# Patient Record
Sex: Male | Born: 1960 | Race: White | Hispanic: No | Marital: Married | Smoking: Former smoker
Health system: Southern US, Academic
[De-identification: ages and names within clinical notes are randomized; demographics above are authoritative.]

## PROBLEM LIST (undated history)

## (undated) DIAGNOSIS — E7800 Pure hypercholesterolemia, unspecified: Secondary | ICD-10-CM

## (undated) HISTORY — PX: HX GALL BLADDER SURGERY/CHOLE: SHX55

## (undated) HISTORY — PX: SHOULDER SURGERY: SHX246

## (undated) HISTORY — PX: HX KNEE ARTHROSCOPY: SHX127

## (undated) HISTORY — DX: Pure hypercholesterolemia, unspecified: E78.00

---

## 1992-02-24 ENCOUNTER — Other Ambulatory Visit (HOSPITAL_COMMUNITY): Payer: Self-pay

## 2021-02-13 IMAGING — MR MRI HIP RT W/O CONTRAST
4 of 5 series · 21 of 40 positions shown · non-contrast
Comparison: None previous.

﻿EXAM:  [id number]   MRI HIP RT W/O CONTRAST
INDICATION: 60-year-old with persistent low back pain and right hip pain.
TECHNIQUE: Axial and coronal images of pelvis and right hip were obtained including T1, fat suppressed T2 and inversion recovery sequences.

[Series 7: T2 fat-sat · axial · right · 4.5mm · 0.96mm/px · z∈[-82,+63]mm · 8 of 30 slices shown (1 of 2)]
[im 1/30]
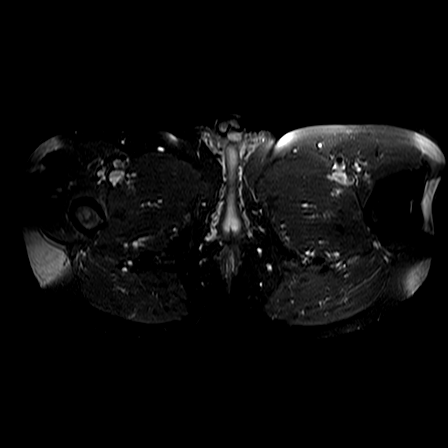
[im 4/30]
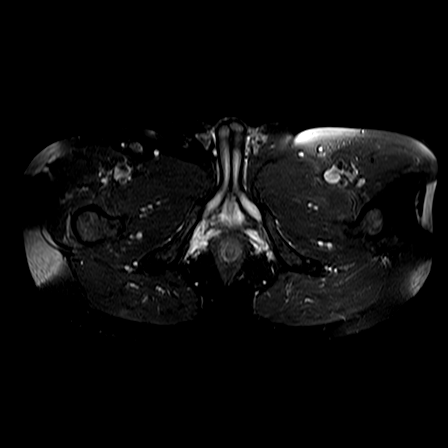
[im 10/30]
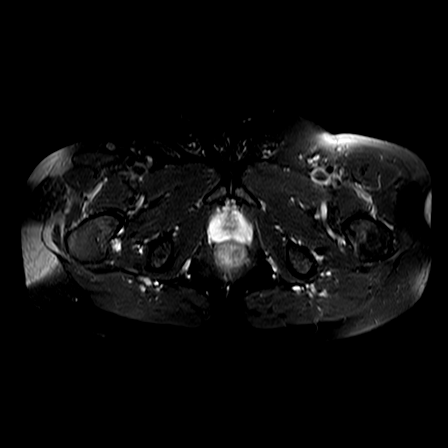
[im 13/30]
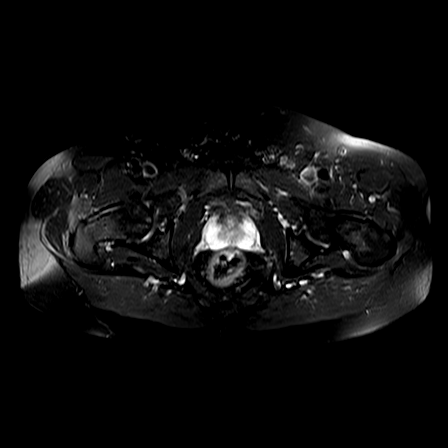
[im 17/30]
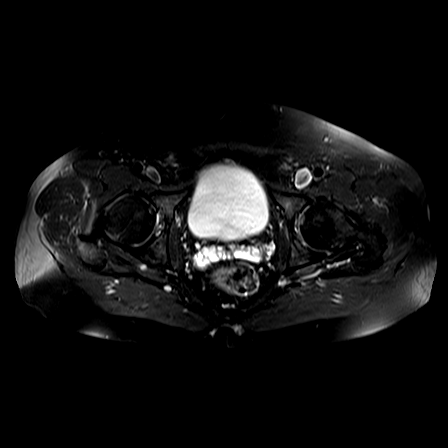
[im 20/30]
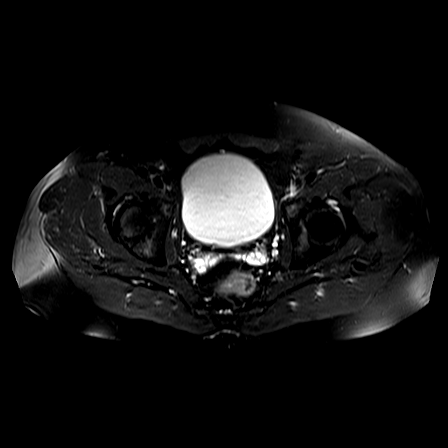
[im 26/30]
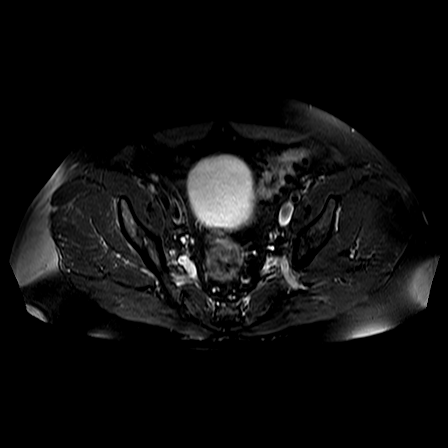
[im 30/30]
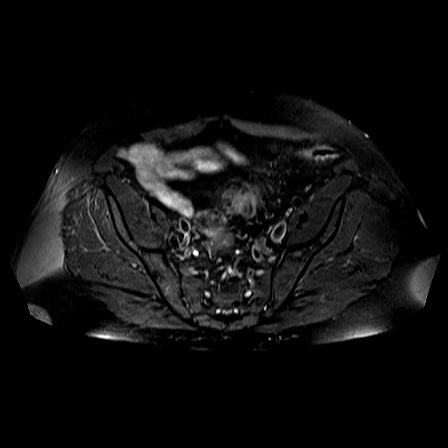

[Series 8: T1 · axial · right · 4.5mm · 0.84mm/px · z∈[-82,+43]mm · 4 of 30 slices shown (1 of 2)]
[im 1/30]
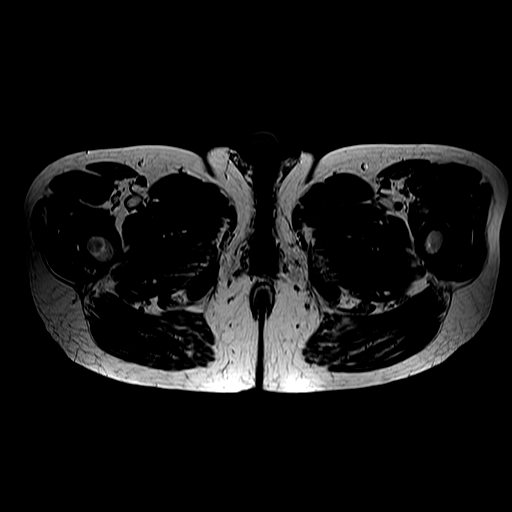
[im 4/30]
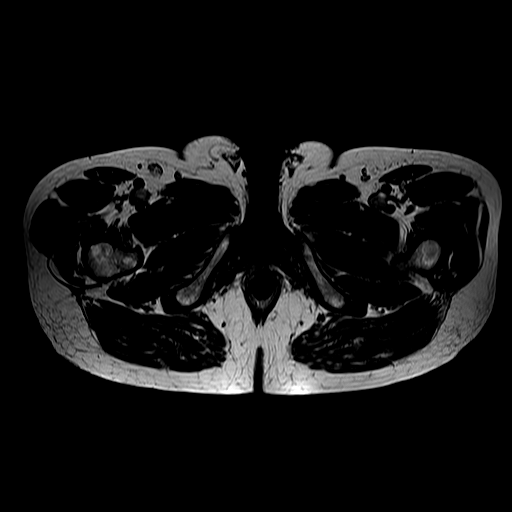
[im 15/30]
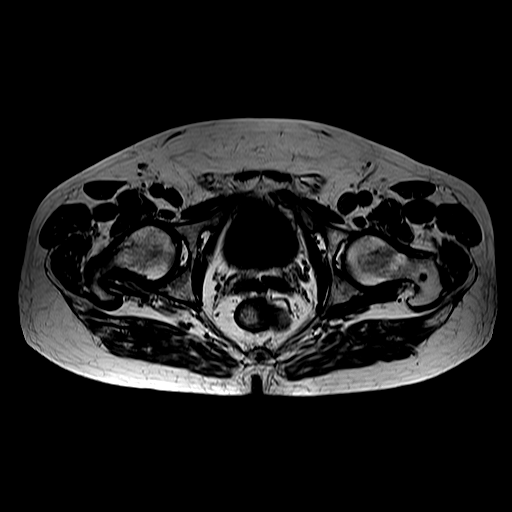
[im 26/30]
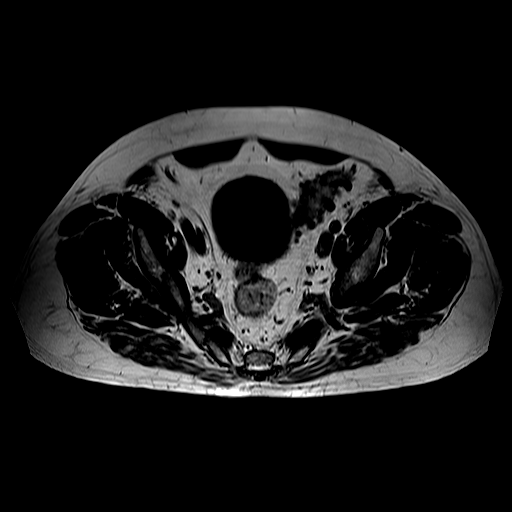

[Series 10: T1 · coronal · right · 4.5mm · 0.82mm/px · 3 of 20 slices shown (2 of 2)]
[im 4/20]
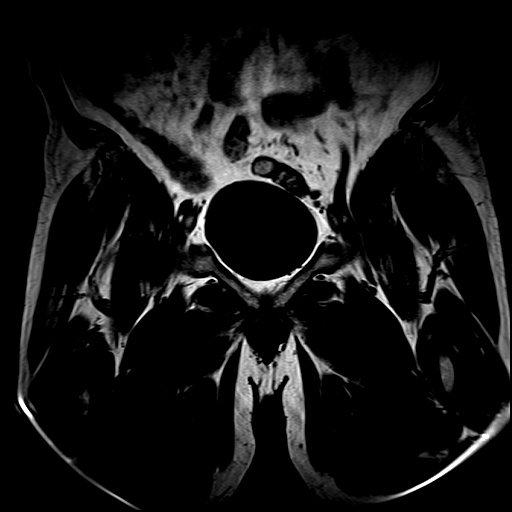
[im 12/20]
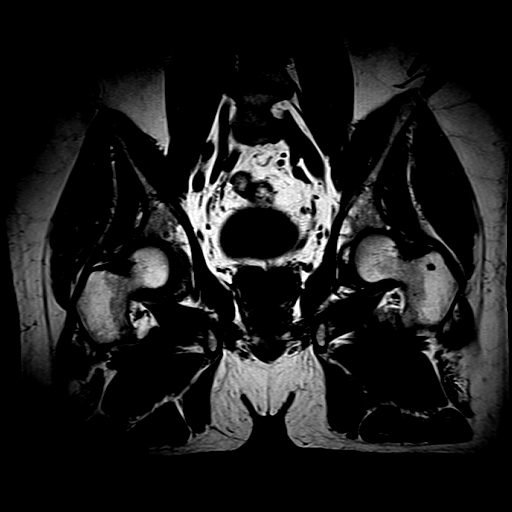
[im 20/20]
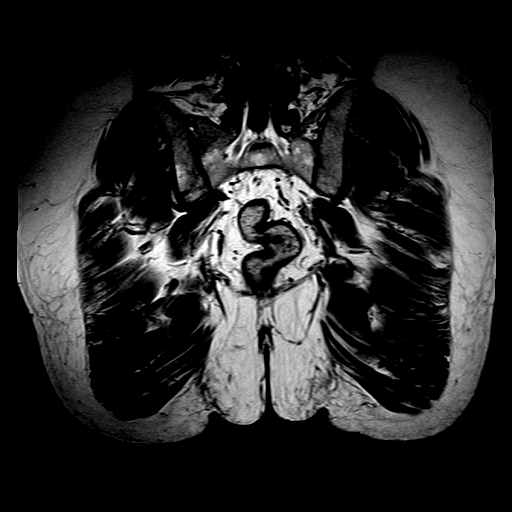

[Series 11: T2 fat-sat · coronal · right · 4.5mm · 0.82mm/px · 6 of 20 slices shown (2 of 2)]
[im 1/20]
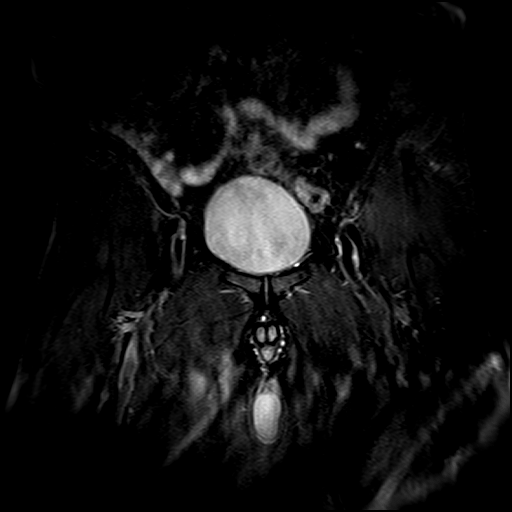
[im 4/20]
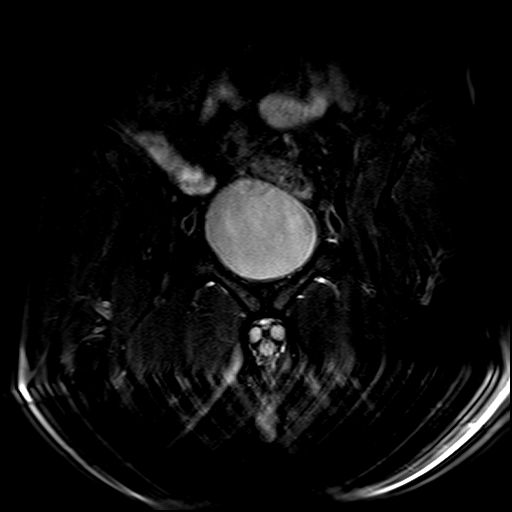
[im 8/20]
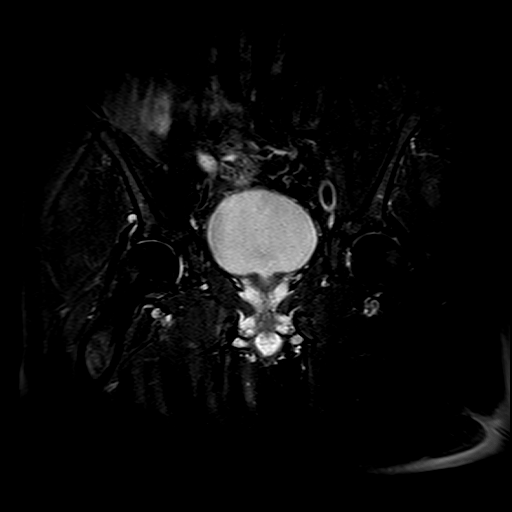
[im 12/20]
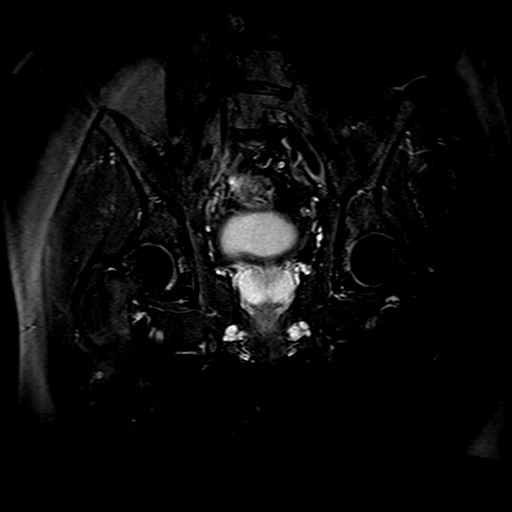
[im 16/20]
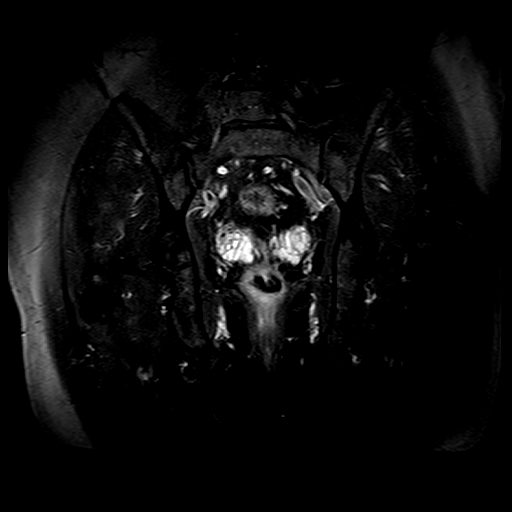
[im 20/20]
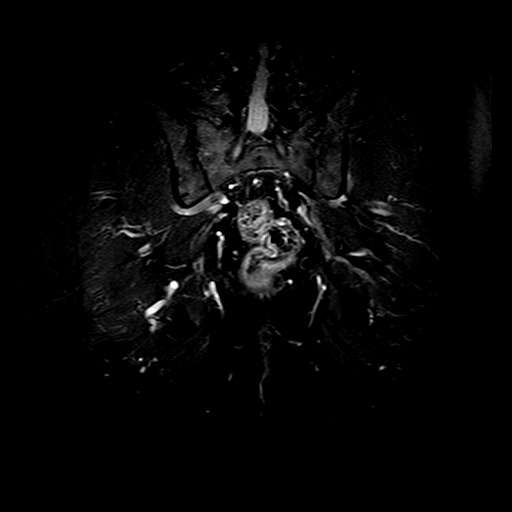

[21 of 40 positions shown; findings below may reference images not displayed]

FINDINGS: No acute bony lesions of the pelvis are seen.  Moderate degenerative arthritic changes of both hip joints, right worse than the left.  Small 6-7 mm cyst in the superior labrum lip is noted at the right hip without a displaced tear.  There is extension of the cyst to the paralabral location above the acetabulum.  Findings are suggestive of chronic tear and degenerative changes of the superior labral lip of the acetabulum.  Soft tissues at the right hip are normal.
IMPRESSION: 1. No MRI evidence of avascular necrosis. 

2. Moderate degenerative arthritic changes of both hips, right worse than the left.  

3. Degenerative changes of the superior lip of the acetabular labrum with cyst and paralabral cyst as described above due to chronic changes of the acetabular labrum.  No displaced fragments are seen.

## 2021-02-13 IMAGING — MR MRI LUMBAR SPINE WITHOUT CONTRAST
5 of 6 series · 31 of 48 positions shown · non-contrast
Comparison: None previous.

﻿EXAM:  [id number]   MRI LUMBAR SPINE WITHOUT CONTRAST
INDICATION: 60-year-old with persistent low back pain and radicular symptoms to right hip.  No history of malignancy or back surgery.
TECHNIQUE: Axial, coronal and sagittal images were obtained including T1, T2 and STIR sequences.

[Series 6: T2 · sagittal · 4.0mm · 0.94mm/px · 6 of 15 slices shown (1 of 3)]
[im 1/15]
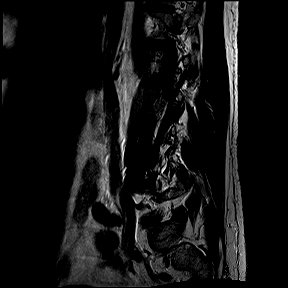
[im 3/15]
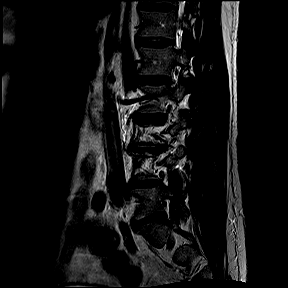
[im 6/15]
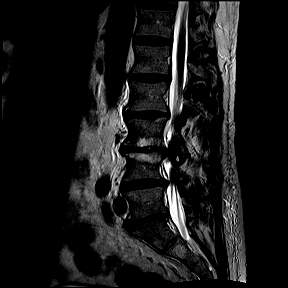
[im 9/15]
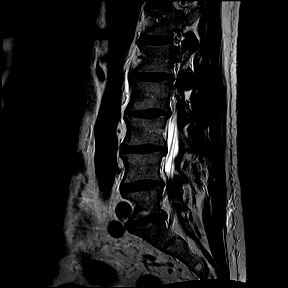
[im 12/15]
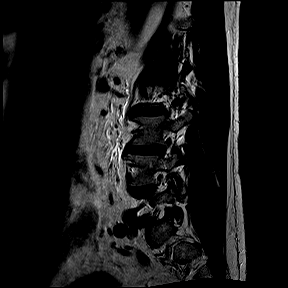
[im 15/15]
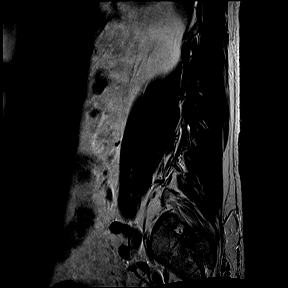

[Series 7: T1 · sagittal · 4.0mm · 0.94mm/px · 7 of 15 slices shown (1 of 2)]
[im 1/15]
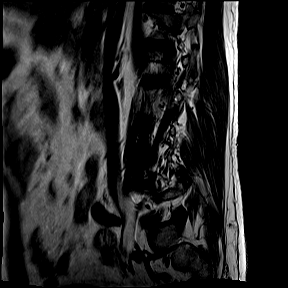
[im 3/15]
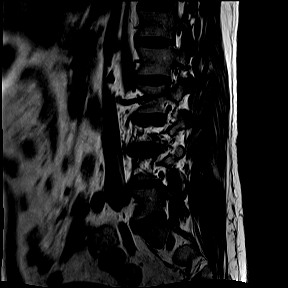
[im 5/15]
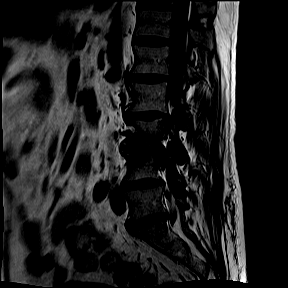
[im 8/15]
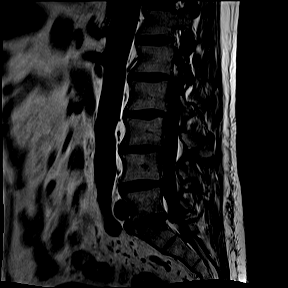
[im 10/15]
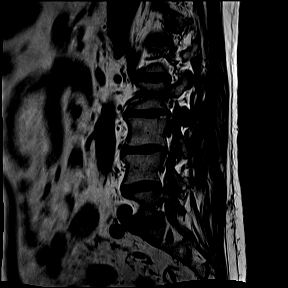
[im 12/15]
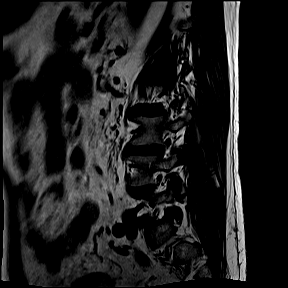
[im 15/15]
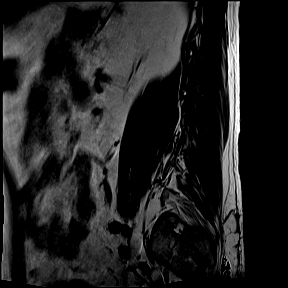

[Series 9: T2 · axial · 4.0mm · 0.52mm/px · z∈[-146,+34]mm · 8 of 23 slices shown (2 of 3)]
[im 1/23]
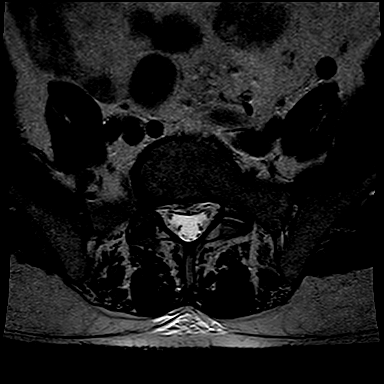
[im 3/23]
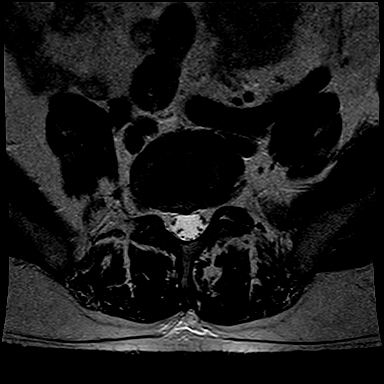
[im 8/23]
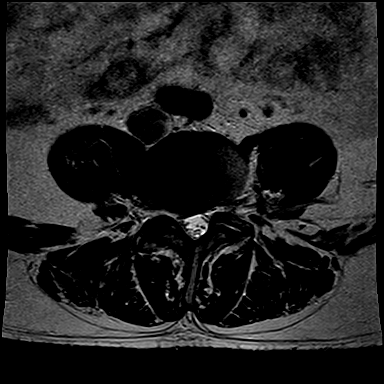
[im 10/23]
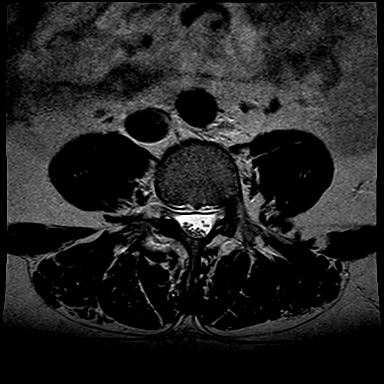
[im 13/23]
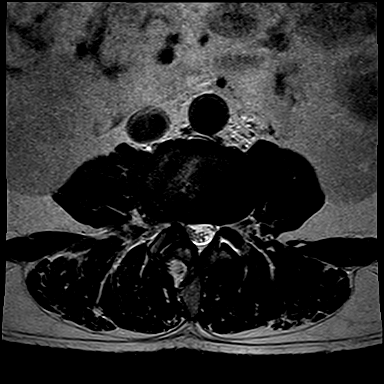
[im 15/23]
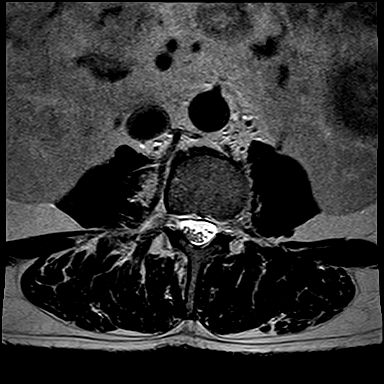
[im 20/23]
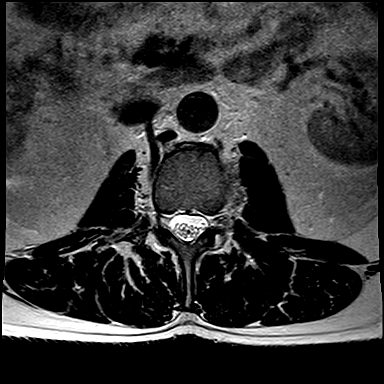
[im 23/23]
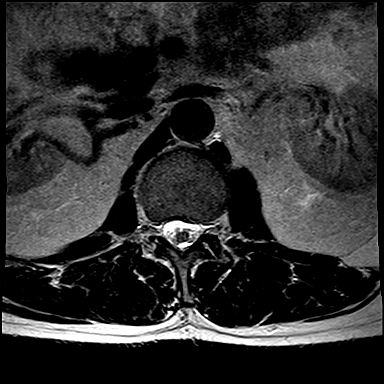

[Series 10: T1 · axial · 4.0mm · 0.52mm/px · z∈[-146,-136]mm · 2 of 23 slices shown (2 of 2)]
[im 1/23]
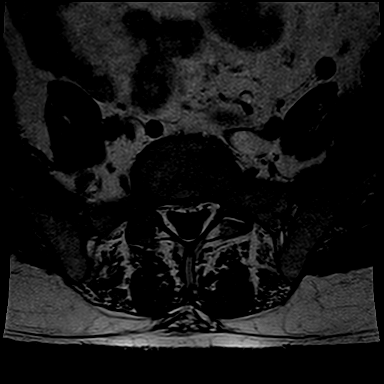
[im 3/23]
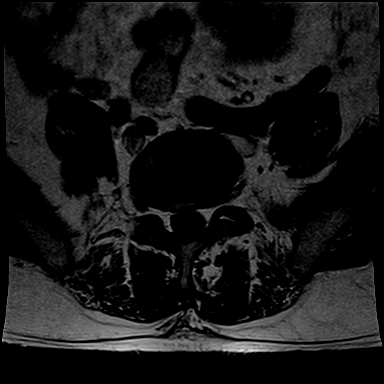

[Series 11: T2 · coronal · 5.0mm · 0.82mm/px · 8 of 18 slices shown (3 of 3)]
[im 1/18]
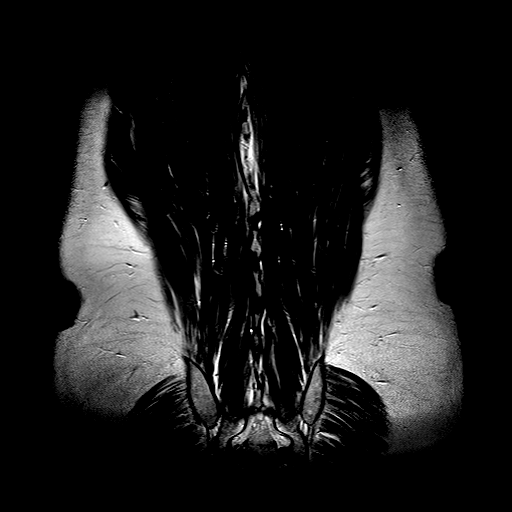
[im 3/18]
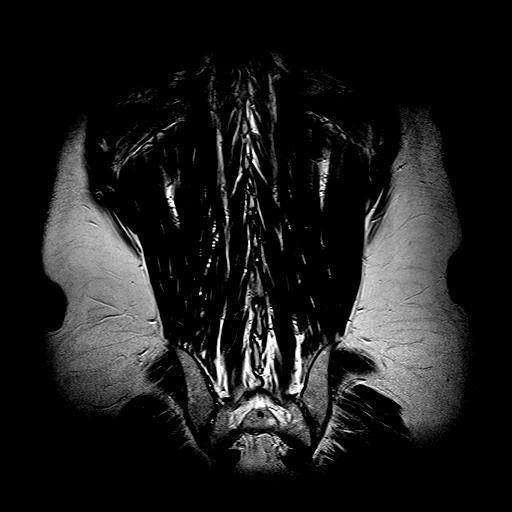
[im 5/18]
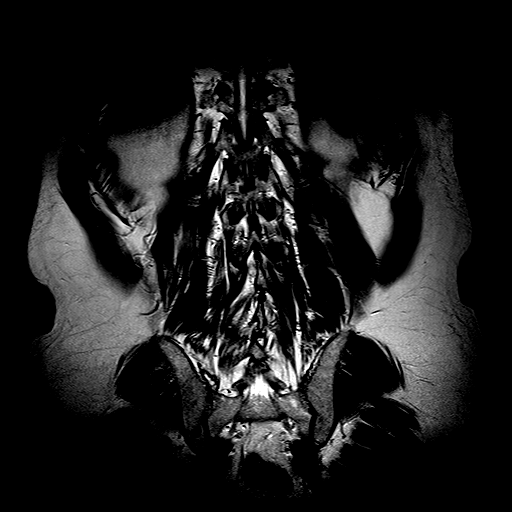
[im 8/18]
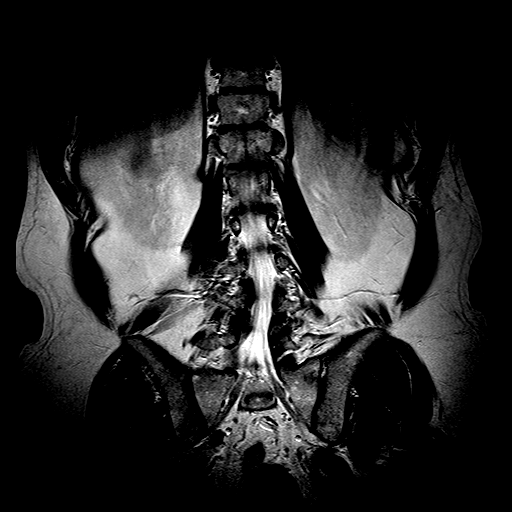
[im 10/18]
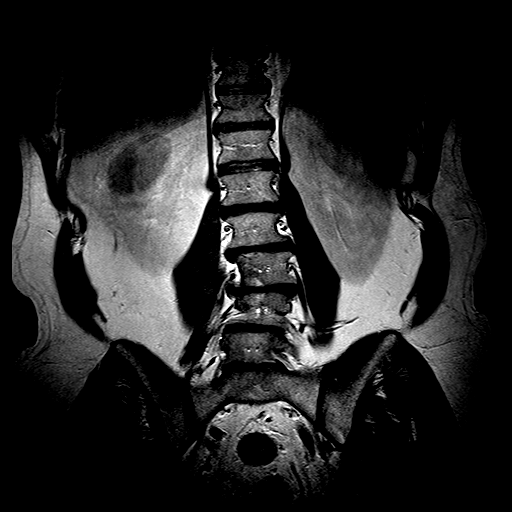
[im 13/18]
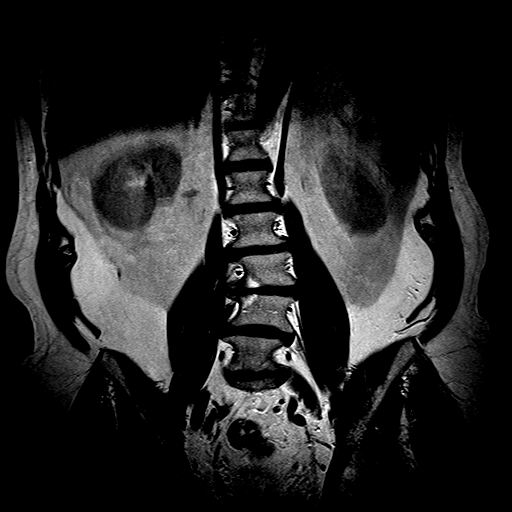
[im 15/18]
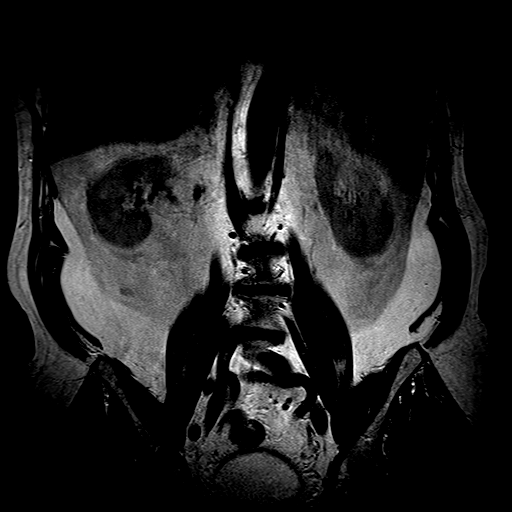
[im 18/18]
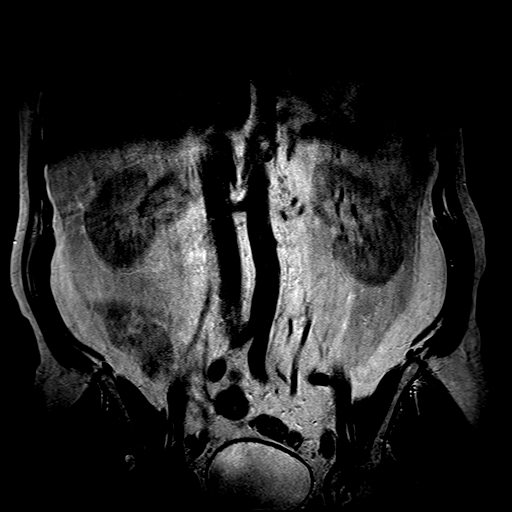

[31 of 48 positions shown; findings below may reference images not displayed]

FINDINGS: No acute bony lesions of lumbar vertebrae are seen.  Lower spinal cord and cauda equina are normal.  

At L1-L2 level, no focal disc lesions are seen.  

At L2-L3 level, degenerative disc disease with bulging annulus and facet arthropathy are causing mild compromise of thecal sac and moderate compromise of both lateral recesses.  AP diameter of thecal sac in the midline measures 8.7 mm.  

At L3-L4 level, severe degenerative disc disease with mild Modic inflammatory changes on both sides of the disc.  Severe bilateral facet arthropathy is noted.  Significant compromise of right lateral recess and right neural foramen due to degenerative changes and facet changes with moderate degree of left foraminal stenosis at this level.  AP diameter of thecal sac in the midline at L3-L4 level measures 9.9 mm.  

At L4-L5 level, asymmetric bulging annulus to the right with small broad-based disc protrusion at the right lateral recess causing moderate to significant compromise of right lateral recess and right neural foramen.  The disc protrusion is impinging on the right L5 and L4 nerve roots at this level.  

At L5-S1 level, severe degenerative disc disease is noted with bulging annulus causing mild to moderate bilateral foraminal narrowing, left more than the right.  

Paravertebral soft tissues are unremarkable.
IMPRESSION: 1. No acute bone changes of lumbar vertebrae. 

2. At L3-L4 level, severe degenerative disc disease with mild Modic inflammatory changes on both sides of the disc.  Severe bilateral facet arthropathy is noted.  Significant compromise of right lateral recess and right neural foramen due to degenerative changes and facet changes with moderate degree of left foraminal stenosis at this level.  AP diameter of thecal sac in the midline at L3-L4 level measures 9.9 mm.  

3. At L4-L5 level, asymmetric bulging annulus to the right with small broad-based disc protrusion at the right lateral recess causing moderate to significant compromise of right lateral recess and right neural foramen.  The disc protrusion is impinging on the right L5 and L4 nerve roots at this level.  

4. At L5-S1 level, severe degenerative disc disease is noted with bulging annulus causing mild to moderate bilateral foraminal narrowing, left more than the right.

## 2021-08-14 ENCOUNTER — Other Ambulatory Visit: Payer: 59 | Attending: Orthopaedic Surgery

## 2021-08-14 ENCOUNTER — Other Ambulatory Visit: Payer: Self-pay

## 2021-08-14 DIAGNOSIS — Z01818 Encounter for other preprocedural examination: Secondary | ICD-10-CM | POA: Insufficient documentation

## 2021-08-14 LAB — CBC WITH DIFF
BASOPHIL #: 0 10*3/uL (ref 0.00–0.30)
BASOPHIL %: 1 % (ref 0–3)
EOSINOPHIL #: 0.1 10*3/uL (ref 0.00–0.80)
EOSINOPHIL %: 1 % (ref 0–7)
HCT: 44.5 % (ref 42.0–51.0)
HGB: 15.6 g/dL (ref 13.5–18.0)
LYMPHOCYTE #: 2.1 10*3/uL (ref 1.10–5.00)
LYMPHOCYTE %: 30 % (ref 25–45)
MCH: 30.4 pg (ref 27.0–32.0)
MCHC: 35.1 g/dL (ref 32.0–36.0)
MCV: 86.6 fL (ref 78.0–99.0)
MONOCYTE #: 0.9 10*3/uL (ref 0.00–1.30)
MONOCYTE %: 12 % (ref 0–12)
MPV: 7.9 fL (ref 7.4–10.4)
NEUTROPHIL #: 4 10*3/uL (ref 1.80–8.40)
NEUTROPHIL %: 56 % (ref 40–76)
PLATELETS: 246 10*3/uL (ref 140–440)
RBC: 5.14 10*6/uL (ref 4.20–6.00)
RDW: 13.8 % (ref 11.6–14.8)
WBC: 7.1 10*3/uL (ref 4.0–10.5)
WBCS UNCORRECTED: 7.1 10*3/uL

## 2021-08-14 LAB — URINALYSIS, MICROSCOPIC: RBCS: <1 /hpf (ref <4)

## 2021-08-14 LAB — BASIC METABOLIC PANEL
ANION GAP: 7 mmol/L — ABNORMAL LOW (ref 10–20)
BUN/CREA RATIO: 18 (ref 6–22)
BUN: 16 mg/dL (ref 7–25)
CALCIUM: 9.7 mg/dL (ref 8.6–10.3)
CHLORIDE: 101 mmol/L (ref 98–107)
CO2 TOTAL: 29 mmol/L (ref 21–31)
CREATININE: 0.87 mg/dL (ref 0.60–1.30)
ESTIMATED GFR: 99 mL/min/{1.73_m2} (ref >59)
GLUCOSE: 86 mg/dL (ref 74–109)
OSMOLALITY, CALCULATED: 274 mOsm/kg (ref 270–290)
POTASSIUM: 4.6 mmol/L (ref 3.5–5.1)
SODIUM: 137 mmol/L (ref 136–145)

## 2021-08-14 LAB — URINALYSIS, MACROSCOPIC
BILIRUBIN: NEGATIVE mg/dL
BLOOD: NEGATIVE mg/dL
GLUCOSE: NEGATIVE mg/dL
KETONES: NEGATIVE mg/dL
LEUKOCYTES: NEGATIVE WBCs/uL
NITRITE: NEGATIVE
PH: 7 (ref 5.0–9.0)
PROTEIN: NEGATIVE mg/dL
SPECIFIC GRAVITY: 1.013 (ref 1.002–1.030)
UROBILINOGEN: NORMAL mg/dL

## 2021-08-14 LAB — HGA1C (HEMOGLOBIN A1C WITH EST AVG GLUCOSE): HEMOGLOBIN A1C: 5.9 % (ref 4.0–6.0)

## 2021-08-20 ENCOUNTER — Ambulatory Visit (HOSPITAL_COMMUNITY): Payer: 59 | Admitting: Student in an Organized Health Care Education/Training Program

## 2021-08-20 ENCOUNTER — Encounter (HOSPITAL_COMMUNITY)
Admission: RE | Disposition: A | Discharge status: Home / Self Care | Payer: Self-pay | Source: Ambulatory Visit | Attending: Orthopaedic Surgery

## 2021-08-20 ENCOUNTER — Observation Stay (HOSPITAL_BASED_OUTPATIENT_CLINIC_OR_DEPARTMENT_OTHER): Payer: 59

## 2021-08-20 ENCOUNTER — Other Ambulatory Visit: Payer: Self-pay

## 2021-08-20 ENCOUNTER — Observation Stay (HOSPITAL_COMMUNITY): Payer: 59 | Admitting: Orthopaedic Surgery

## 2021-08-20 ENCOUNTER — Observation Stay
Admission: RE | Admit: 2021-08-20 | Discharge: 2021-08-21 | Disposition: A | Discharge status: Home / Self Care | Payer: 59 | Source: Ambulatory Visit | Attending: Orthopaedic Surgery | Admitting: Orthopaedic Surgery

## 2021-08-20 ENCOUNTER — Encounter (HOSPITAL_COMMUNITY): Payer: Self-pay | Admitting: Orthopaedic Surgery

## 2021-08-20 DIAGNOSIS — M1611 Unilateral primary osteoarthritis, right hip: Principal | ICD-10-CM | POA: Insufficient documentation

## 2021-08-20 DIAGNOSIS — Z96641 Presence of right artificial hip joint: Secondary | ICD-10-CM | POA: Diagnosis present

## 2021-08-20 LAB — ECG 12 LEAD
Atrial Rate: 79 {beats}/min
Calculated P Axis: 35 degrees
Calculated R Axis: 57 degrees
Calculated T Axis: 67 degrees
PR Interval: 140 ms
QRS Duration: 90 ms
QT Interval: 374 ms
QTC Calculation: 428 ms
Ventricular rate: 79 {beats}/min

## 2021-08-20 SURGERY — ARTHROPLASTY HIP TOTAL
Anesthesia: General | Site: Hip | Laterality: Right | Wound class: Clean Wound: Uninfected operative wounds in which no inflammation occurred

## 2021-08-20 MED ORDER — TRANEXAMIC ACID 1,000 MG/10 ML (100 MG/ML) INTRAVENOUS SOLUTION
INTRAVENOUS | Status: AC
Start: 2021-08-20 — End: 2021-08-20
  Filled 2021-08-20: qty 10

## 2021-08-20 MED ORDER — LACTATED RINGERS INTRAVENOUS SOLUTION
INTRAVENOUS | Status: DC
Start: 2021-08-20 — End: 2021-08-21

## 2021-08-20 MED ORDER — FENTANYL (PF) 50 MCG/ML INJECTION WRAPPER
50.0000 ug | INJECTION | INTRAMUSCULAR | Status: DC | PRN
Start: 2021-08-20 — End: 2021-08-20
  Administered 2021-08-20: 50 ug via INTRAVENOUS

## 2021-08-20 MED ORDER — HYDROMORPHONE 2 MG/ML INJECTION WRAPPER
0.2000 mg | INJECTION | INTRAMUSCULAR | Status: DC | PRN
Start: 2021-08-20 — End: 2021-08-21
  Administered 2021-08-21: 0.2 mg via INTRAVENOUS
  Filled 2021-08-20: qty 1

## 2021-08-20 MED ORDER — TRAMADOL 50 MG TABLET
50.0000 mg | ORAL_TABLET | Freq: Four times a day (QID) | ORAL | Status: DC
Start: 2021-08-20 — End: 2021-08-21
  Administered 2021-08-20 – 2021-08-21 (×5): 0 mg via ORAL
  Filled 2021-08-20 (×4): qty 1

## 2021-08-20 MED ORDER — ACETAMINOPHEN 325 MG TABLET
975.0000 mg | ORAL_TABLET | Freq: Once | ORAL | Status: AC
Start: 2021-08-20 — End: 2021-08-20
  Administered 2021-08-20: 975 mg via ORAL

## 2021-08-20 MED ORDER — ROCURONIUM 10 MG/ML INTRAVENOUS SOLUTION
Freq: Once | INTRAVENOUS | Status: DC | PRN
Start: 2021-08-20 — End: 2021-08-20
  Administered 2021-08-20 (×2): 50 mg via INTRAVENOUS

## 2021-08-20 MED ORDER — ACETAMINOPHEN 325 MG TABLET
650.0000 mg | ORAL_TABLET | ORAL | Status: DC | PRN
Start: 2021-08-20 — End: 2021-08-21

## 2021-08-20 MED ORDER — ETHYL ALCOHOL 62 % (NOZIN NASAL SANITIZER) NASAL SOLUTION - BULK BOTTLE
1.0000 | Freq: Once | NASAL | Status: AC
Start: 2021-08-20 — End: 2021-08-20
  Administered 2021-08-20: 1 via NASAL

## 2021-08-20 MED ORDER — APREPITANT 40 MG CAPSULE
ORAL_CAPSULE | ORAL | Status: AC
Start: 2021-08-20 — End: 2021-08-20
  Filled 2021-08-20: qty 1

## 2021-08-20 MED ORDER — SUGAMMADEX 100 MG/ML INTRAVENOUS SOLUTION
Freq: Once | INTRAVENOUS | Status: DC | PRN
Start: 2021-08-20 — End: 2021-08-20
  Administered 2021-08-20: 200 mg via INTRAVENOUS

## 2021-08-20 MED ORDER — ASPIRIN 81 MG CHEWABLE TABLET
81.0000 mg | CHEWABLE_TABLET | Freq: Two times a day (BID) | ORAL | Status: DC
Start: 2021-08-21 — End: 2021-08-21
  Administered 2021-08-21: 81 mg via ORAL
  Filled 2021-08-20: qty 1

## 2021-08-20 MED ORDER — DEXAMETHASONE SODIUM PHOSPHATE (PF) 10 MG/ML INJECTION SOLUTION
8.0000 mg | Freq: Two times a day (BID) | INTRAMUSCULAR | Status: AC
Start: 2021-08-20 — End: 2021-08-21
  Administered 2021-08-20 – 2021-08-21 (×2): 8 mg via INTRAVENOUS
  Filled 2021-08-20 (×2): qty 1

## 2021-08-20 MED ORDER — IPRATROPIUM 0.5 MG-ALBUTEROL 3 MG (2.5 MG BASE)/3 ML NEBULIZATION SOLN
3.0000 mL | INHALATION_SOLUTION | Freq: Once | RESPIRATORY_TRACT | Status: DC | PRN
Start: 2021-08-20 — End: 2021-08-20

## 2021-08-20 MED ORDER — SODIUM CHLORIDE 0.9 % INTRAVENOUS PIGGYBACK
1000.0000 mg | INJECTION | Freq: Once | INTRAVENOUS | Status: AC
Start: 2021-08-20 — End: 2021-08-20
  Administered 2021-08-20: 1000 mg via INTRAVENOUS

## 2021-08-20 MED ORDER — LIDOCAINE (PF) 100 MG/5 ML (2 %) INTRAVENOUS SYRINGE
INJECTION | Freq: Once | INTRAVENOUS | Status: DC | PRN
Start: 2021-08-20 — End: 2021-08-20
  Administered 2021-08-20: 100 mg via INTRAVENOUS

## 2021-08-20 MED ORDER — MIDAZOLAM 5 MG/ML INJECTION WRAPPER
1.5000 mg | Freq: Once | INTRAMUSCULAR | Status: DC | PRN
Start: 2021-08-20 — End: 2021-08-20
  Administered 2021-08-20: 1.5 mg via INTRAVENOUS

## 2021-08-20 MED ORDER — SODIUM CHLORIDE 0.9 % (FLUSH) INJECTION SYRINGE
3.0000 mL | INJECTION | INTRAMUSCULAR | Status: DC | PRN
Start: 2021-08-20 — End: 2021-08-20

## 2021-08-20 MED ORDER — ROPIVACAINE (PF) 2 MG/ML (0.2 %) INJECTION SOLUTION
INTRAMUSCULAR | Status: AC
Start: 2021-08-20 — End: 2021-08-20
  Filled 2021-08-20: qty 10

## 2021-08-20 MED ORDER — CELECOXIB 100 MG CAPSULE
200.0000 mg | ORAL_CAPSULE | Freq: Once | ORAL | Status: AC
Start: 2021-08-20 — End: 2021-08-20
  Administered 2021-08-20: 200 mg via ORAL

## 2021-08-20 MED ORDER — OXYCODONE-ACETAMINOPHEN 5 MG-325 MG TABLET
2.0000 | ORAL_TABLET | ORAL | Status: DC | PRN
Start: 2021-08-20 — End: 2021-08-21
  Administered 2021-08-20: 2 via ORAL
  Filled 2021-08-20: qty 2

## 2021-08-20 MED ORDER — ACETAMINOPHEN 325 MG TABLET
ORAL_TABLET | ORAL | Status: AC
Start: 2021-08-20 — End: 2021-08-20
  Filled 2021-08-20: qty 3

## 2021-08-20 MED ORDER — ETHYL ALCOHOL 62 % (NOZIN NASAL SANITIZER) NASAL SOLUTION - BULK BOTTLE
1.0000 | Freq: Two times a day (BID) | NASAL | Status: DC
Start: 2021-08-20 — End: 2021-08-21
  Administered 2021-08-20 – 2021-08-21 (×2): 1 via NASAL

## 2021-08-20 MED ORDER — OXYCODONE ER 10 MG TABLET,CRUSH RESISTANT,EXTENDED RELEASE 12 HR
20.0000 mg | EXTENDED_RELEASE_ORAL_TABLET | Freq: Once | ORAL | Status: AC
Start: 2021-08-20 — End: 2021-08-20
  Administered 2021-08-20: 20 mg via ORAL

## 2021-08-20 MED ORDER — DOCUSATE SODIUM 100 MG CAPSULE
100.0000 mg | ORAL_CAPSULE | Freq: Two times a day (BID) | ORAL | Status: DC
Start: 2021-08-20 — End: 2021-08-21
  Administered 2021-08-20 – 2021-08-21 (×3): 100 mg via ORAL
  Filled 2021-08-20 (×3): qty 1

## 2021-08-20 MED ORDER — CALCIUM CHLORIDE 100 MG/ML (10 %) INTRAVENOUS SYRINGE
INJECTION | Freq: Once | INTRAVENOUS | Status: DC | PRN
Start: 2021-08-20 — End: 2021-08-20
  Administered 2021-08-20 (×2): 250 mg via INTRAVENOUS
  Administered 2021-08-20: 400 mg via INTRAVENOUS

## 2021-08-20 MED ORDER — ONDANSETRON HCL (PF) 4 MG/2 ML INJECTION SOLUTION
4.0000 mg | Freq: Once | INTRAMUSCULAR | Status: DC | PRN
Start: 2021-08-20 — End: 2021-08-20
  Administered 2021-08-20: 8 mg via INTRAVENOUS

## 2021-08-20 MED ORDER — MORPHINE 2 MG/ML INJECTION WRAPPER
2.0000 mg | INJECTION | INTRAMUSCULAR | Status: DC | PRN
Start: 2021-08-20 — End: 2021-08-21

## 2021-08-20 MED ORDER — GABAPENTIN 300 MG CAPSULE
300.0000 mg | ORAL_CAPSULE | Freq: Once | ORAL | Status: AC
Start: 2021-08-20 — End: 2021-08-20
  Administered 2021-08-20: 300 mg via ORAL

## 2021-08-20 MED ORDER — PROPOFOL 10 MG/ML IV BOLUS
INJECTION | Freq: Once | INTRAVENOUS | Status: DC | PRN
Start: 2021-08-20 — End: 2021-08-20
  Administered 2021-08-20: 200 mg via INTRAVENOUS

## 2021-08-20 MED ORDER — NALOXONE 0.4 MG/ML INJECTION SOLUTION
0.4000 mg | INTRAMUSCULAR | Status: DC | PRN
Start: 2021-08-20 — End: 2021-08-21

## 2021-08-20 MED ORDER — PHENYLEPHRINE 10 MG/ML INJECTION SOLUTION
Freq: Once | INTRAMUSCULAR | Status: DC | PRN
Start: 2021-08-20 — End: 2021-08-20
  Administered 2021-08-20: 200 ug via INTRAVENOUS
  Administered 2021-08-20 (×2): 100 ug via INTRAVENOUS
  Administered 2021-08-20 (×3): 200 ug via INTRAVENOUS
  Administered 2021-08-20: 100 ug via INTRAVENOUS
  Administered 2021-08-20 (×2): 200 ug via INTRAVENOUS

## 2021-08-20 MED ORDER — ONDANSETRON HCL (PF) 4 MG/2 ML INJECTION SOLUTION
INTRAMUSCULAR | Status: AC
Start: 2021-08-20 — End: 2021-08-20
  Filled 2021-08-20: qty 4

## 2021-08-20 MED ORDER — SODIUM CHLORIDE 0.9 % (FLUSH) INJECTION SYRINGE
3.0000 mL | INJECTION | INTRAMUSCULAR | Status: DC | PRN
Start: 2021-08-20 — End: 2021-08-21

## 2021-08-20 MED ORDER — SODIUM CHLORIDE 0.9 % INTRAVENOUS PIGGYBACK
INJECTION | INTRAVENOUS | Status: AC
Start: 2021-08-20 — End: 2021-08-20
  Filled 2021-08-20: qty 150

## 2021-08-20 MED ORDER — DEXAMETHASONE SODIUM PHOSPHATE (PF) 10 MG/ML INJECTION SOLUTION
INTRAMUSCULAR | Status: AC
Start: 2021-08-20 — End: 2021-08-20
  Filled 2021-08-20: qty 1

## 2021-08-20 MED ORDER — OXYCODONE ER 10 MG TABLET,CRUSH RESISTANT,EXTENDED RELEASE 12 HR
10.0000 mg | EXTENDED_RELEASE_ORAL_TABLET | Freq: Once | ORAL | Status: AC
Start: 2021-08-20 — End: 2021-08-20
  Administered 2021-08-20: 10 mg via ORAL
  Filled 2021-08-20: qty 1

## 2021-08-20 MED ORDER — FAMOTIDINE (PF) 20 MG/2 ML INTRAVENOUS SOLUTION
INTRAVENOUS | Status: AC
Start: 2021-08-20 — End: 2021-08-20
  Filled 2021-08-20: qty 2

## 2021-08-20 MED ORDER — ACETAMINOPHEN 325 MG TABLET
975.0000 mg | ORAL_TABLET | Freq: Four times a day (QID) | ORAL | Status: AC
Start: 2021-08-20 — End: 2021-08-20
  Administered 2021-08-20 (×2): 975 mg via ORAL
  Filled 2021-08-20 (×2): qty 3

## 2021-08-20 MED ORDER — FENTANYL (PF) 50 MCG/ML INJECTION SOLUTION
INTRAMUSCULAR | Status: AC
Start: 2021-08-20 — End: 2021-08-20
  Filled 2021-08-20: qty 2

## 2021-08-20 MED ORDER — SODIUM CHLORIDE 0.9 % INTRAVENOUS SOLUTION
INTRAVENOUS | Status: AC
Start: 2021-08-20 — End: 2021-08-21
  Administered 2021-08-20: 0 mL via INTRAVENOUS

## 2021-08-20 MED ORDER — CHOLECALCIFEROL (VITAMIN D3) 25 MCG (1,000 UNIT) TABLET
1000.0000 [IU] | ORAL_TABLET | Freq: Every day | ORAL | Status: DC
Start: 2021-08-20 — End: 2021-08-21
  Administered 2021-08-20 – 2021-08-21 (×2): 1000 [IU] via ORAL
  Filled 2021-08-20 (×2): qty 1

## 2021-08-20 MED ORDER — APREPITANT 40 MG CAPSULE
40.0000 mg | ORAL_CAPSULE | Freq: Once | ORAL | Status: AC
Start: 2021-08-20 — End: 2021-08-20
  Administered 2021-08-20: 40 mg via ORAL

## 2021-08-20 MED ORDER — EPHEDRINE SULFATE 50 MG/ML INTRAVENOUS SOLUTION
Freq: Once | INTRAVENOUS | Status: DC | PRN
Start: 2021-08-20 — End: 2021-08-20
  Administered 2021-08-20: 10 mg via INTRAVENOUS
  Administered 2021-08-20: 15 mg via INTRAVENOUS
  Administered 2021-08-20: 10 mg via INTRAVENOUS

## 2021-08-20 MED ORDER — FAMOTIDINE (PF) 20 MG/2 ML INTRAVENOUS SOLUTION
20.0000 mg | Freq: Once | INTRAVENOUS | Status: AC
Start: 2021-08-20 — End: 2021-08-20
  Administered 2021-08-20: 20 mg via INTRAVENOUS

## 2021-08-20 MED ORDER — FERROUS SULFATE 324 MG (65 MG IRON) TABLET,DELAYED RELEASE
324.0000 mg | DELAYED_RELEASE_TABLET | Freq: Every morning | ORAL | Status: DC
Start: 2021-08-21 — End: 2021-08-21
  Administered 2021-08-21: 324 mg via ORAL
  Filled 2021-08-20: qty 1

## 2021-08-20 MED ORDER — ALBUTEROL SULFATE 2.5 MG/3 ML (0.083 %) SOLUTION FOR NEBULIZATION
2.5000 mg | INHALATION_SOLUTION | Freq: Once | RESPIRATORY_TRACT | Status: DC | PRN
Start: 2021-08-20 — End: 2021-08-20

## 2021-08-20 MED ORDER — KETOROLAC 30 MG/ML (1 ML) INJECTION SOLUTION
15.0000 mg | Freq: Three times a day (TID) | INTRAMUSCULAR | Status: DC
Start: 2021-08-20 — End: 2021-08-21
  Administered 2021-08-20 (×2): 15 mg via INTRAVENOUS
  Administered 2021-08-21: 0 mg via INTRAVENOUS
  Administered 2021-08-21: 15 mg via INTRAVENOUS
  Filled 2021-08-20 (×4): qty 1

## 2021-08-20 MED ORDER — ONDANSETRON HCL (PF) 4 MG/2 ML INJECTION SOLUTION
8.0000 mg | Freq: Once | INTRAMUSCULAR | Status: DC
Start: 2021-08-20 — End: 2021-08-20

## 2021-08-20 MED ORDER — BISACODYL 10 MG RECTAL SUPPOSITORY
10.0000 mg | Freq: Every day | RECTAL | Status: DC | PRN
Start: 2021-08-20 — End: 2021-08-21

## 2021-08-20 MED ORDER — LACTATED RINGERS INTRAVENOUS SOLUTION
INTRAVENOUS | Status: DC
Start: 2021-08-20 — End: 2021-08-20

## 2021-08-20 MED ORDER — FENTANYL (PF) 50 MCG/ML INJECTION WRAPPER
25.0000 ug | INJECTION | INTRAMUSCULAR | Status: DC | PRN
Start: 2021-08-20 — End: 2021-08-20

## 2021-08-20 MED ORDER — ROPIVACAINE (PF) 2 MG/ML (0.2 %) INJECTION SOLUTION
Freq: Once | INTRAMUSCULAR | Status: DC | PRN
Start: 2021-08-20 — End: 2021-08-21
  Administered 2021-08-20: 10 mL

## 2021-08-20 MED ORDER — ONDANSETRON HCL (PF) 4 MG/2 ML INJECTION SOLUTION
4.0000 mg | INTRAMUSCULAR | Status: DC | PRN
Start: 2021-08-20 — End: 2021-08-21

## 2021-08-20 MED ORDER — OXYCODONE ER 10 MG TABLET,CRUSH RESISTANT,EXTENDED RELEASE 12 HR
EXTENDED_RELEASE_ORAL_TABLET | ORAL | Status: AC
Start: 2021-08-20 — End: 2021-08-20
  Filled 2021-08-20: qty 2

## 2021-08-20 MED ORDER — LACTATED RINGERS INTRAVENOUS SOLUTION
INTRAVENOUS | Status: DC
Start: 2021-08-20 — End: 2021-08-21
  Administered 2021-08-20: 0 mL via INTRAVENOUS

## 2021-08-20 MED ORDER — MIDAZOLAM 5 MG/ML INJECTION WRAPPER
INTRAMUSCULAR | Status: AC
Start: 2021-08-20 — End: 2021-08-20
  Filled 2021-08-20: qty 1

## 2021-08-20 MED ORDER — MULTIVITAMIN-FERROUS FUMARATE-FOLIC ACID 18 MG-400 MCG TABLET
1.0000 | ORAL_TABLET | Freq: Every day | ORAL | Status: DC
Start: 2021-08-20 — End: 2021-08-21
  Administered 2021-08-20 – 2021-08-21 (×2): 1 via ORAL
  Filled 2021-08-20 (×2): qty 1

## 2021-08-20 MED ORDER — DEXAMETHASONE SODIUM PHOSPHATE (PF) 10 MG/ML INJECTION SOLUTION
10.0000 mg | Freq: Once | INTRAMUSCULAR | Status: AC
Start: 2021-08-20 — End: 2021-08-20
  Administered 2021-08-20: 10 mg via INTRAVENOUS

## 2021-08-20 MED ORDER — SODIUM CHLORIDE 0.9 % INTRAVENOUS PIGGYBACK
2.0000 g | INJECTION | Freq: Once | INTRAVENOUS | Status: AC
Start: 2021-08-20 — End: 2021-08-20
  Administered 2021-08-20: 2 g via INTRAVENOUS

## 2021-08-20 MED ORDER — SODIUM CHLORIDE 0.9 % (FLUSH) INJECTION SYRINGE
3.0000 mL | INJECTION | Freq: Three times a day (TID) | INTRAMUSCULAR | Status: DC
Start: 2021-08-20 — End: 2021-08-21
  Administered 2021-08-20 (×2): 3 mL
  Administered 2021-08-20: 0 mL
  Administered 2021-08-21: 3 mL

## 2021-08-20 MED ORDER — SUCCINYLCHOLINE 20 MG/ML INTRAVENOUS WRAPPER
INJECTION | Freq: Once | INTRAVENOUS | Status: DC | PRN
Start: 2021-08-20 — End: 2021-08-20
  Administered 2021-08-20: 100 mg via INTRAVENOUS

## 2021-08-20 MED ORDER — SODIUM CHLORIDE 0.9 % (FLUSH) INJECTION SYRINGE
3.0000 mL | INJECTION | Freq: Three times a day (TID) | INTRAMUSCULAR | Status: DC
Start: 2021-08-20 — End: 2021-08-20

## 2021-08-20 MED ORDER — OXYCODONE-ACETAMINOPHEN 5 MG-325 MG TABLET
1.0000 | ORAL_TABLET | ORAL | Status: DC | PRN
Start: 2021-08-20 — End: 2021-08-21
  Administered 2021-08-21: 1 via ORAL
  Filled 2021-08-20: qty 1

## 2021-08-20 MED ORDER — FENTANYL (PF) 50 MCG/ML INJECTION WRAPPER
INJECTION | Freq: Once | INTRAMUSCULAR | Status: DC | PRN
Start: 2021-08-20 — End: 2021-08-20
  Administered 2021-08-20 (×2): 50 ug via INTRAVENOUS

## 2021-08-20 MED ORDER — CEFAZOLIN 1 GRAM SOLUTION FOR INJECTION
INTRAMUSCULAR | Status: AC
Start: 2021-08-20 — End: 2021-08-20
  Filled 2021-08-20: qty 20

## 2021-08-20 MED ORDER — CELECOXIB 100 MG CAPSULE
ORAL_CAPSULE | ORAL | Status: AC
Start: 2021-08-20 — End: 2021-08-20
  Filled 2021-08-20: qty 2

## 2021-08-20 MED ORDER — FAMOTIDINE 20 MG TABLET
20.0000 mg | ORAL_TABLET | Freq: Two times a day (BID) | ORAL | Status: DC
Start: 2021-08-20 — End: 2021-08-21
  Administered 2021-08-20 – 2021-08-21 (×2): 20 mg via ORAL
  Filled 2021-08-20 (×2): qty 1

## 2021-08-20 MED ORDER — GABAPENTIN 300 MG CAPSULE
ORAL_CAPSULE | ORAL | Status: AC
Start: 2021-08-20 — End: 2021-08-20
  Filled 2021-08-20: qty 1

## 2021-08-20 SURGICAL SUPPLY — 100 items
ADH SKNCLS 2-OCTYL CYNCRLT DRMBND PRINEO LIQUID 22CM (SUTURE/WOUND CLOSURE) ×2 IMPLANT
BAG BIOHAZ RD 30X24IN THK3 MIL C8-10GL LLDPE INFCT WASTE CAN (MED SURG SUPPLIES) ×4
BAG BIOHAZ RD 30X24IN THK3 MIL C8-10GL LLDPE INFCT WASTE CAN PNCT RST (MED SURG SUPPLIES) ×2
BAG BIOHAZ RD 43X30IN THK3 MIL C20-30GL LLDPE INFCT WASTE (MED SURG SUPPLIES) ×2
BAG BIOHAZ RD 43X30IN THK3 MIL C20-30GL LLDPE INFCT WASTE CAN PNCT RST (MED SURG SUPPLIES) ×1 IMPLANT
BAG SUT DVN STRL LF (SUTURE/WOUND CLOSURE) ×1 IMPLANT
BAG SUTURE DEVON STERILE LATEX FREE (SUTURE/WOUND CLOSURE) ×2
BLADE 10 2 END CBNSTL SURG STRL DISP (CUTTING ELEMENTS) ×5
BLADE 10 2 END CBNSTL SURG STRL DISP (SURGICAL CUTTING SUPPLIES) ×5 IMPLANT
BLADE SAW 73X25MM 2 CUT SGTL SS THK.89MM MED W STRL LF (CUTTING ELEMENTS) ×1
BLADE SAW 73X25MM 2 CUT SGTL SS THK.89MM MED W STRL LF (SURGICAL CUTTING SUPPLIES) ×1 IMPLANT
CLEANER INSTR PREPZYME MUL-TRD CONTAINR NARSL NEUT PH BDGR (MISCELLANEOUS PT CARE ITEMS) ×2
CONV USE 31829 - NEEDLE HYPO  18GA 1.5IN STD REG BVL LF (MED SURG SUPPLIES) ×1 IMPLANT
CONV USE 86875 - ELECTRODE ESURG BLADE 6IN 3/32IN SS LF (SURGICAL CUTTING SUPPLIES) ×1 IMPLANT
CONV USE ITEM 14518 - SUTURE 5 KV-37 TICRON 30IN BLU BRD 5 STRN COAT NONAB (SUTURE/WOUND CLOSURE) ×1 IMPLANT
CONV USE ITEM 321837 - GLOVE SURG 7.5 LTX PF NONST CRM (GLOVES AND ACCESSORIES) ×1 IMPLANT
CONV USE ITEM 321850 - GLOVE SURG 8.5 LF  BEAD CUF SMOOTH HI GRIP WHT 12IN MDCHC PLISPRN (GLOVES AND ACCESSORIES) IMPLANT
CONV USE ITEM 321852 - GLOVE SURG 6.5 LF  PLISPRN (GLOVES AND ACCESSORIES) IMPLANT
CONV USE ITEM 321854 - GLOVE SURG 6 LF  BEAD CUF SMOOTH HI GRIP WHT 12IN MDCHC PLISPRN (GLOVES AND ACCESSORIES) IMPLANT
CONV USE ITEM 321982 - GLOVE SURG 7 LTX CHEMO PF SMOOTH BEAD CUF STRL WHT 11.6IN PLMR THK.2MM THK.21MM (GLOVES AND ACCESSORIES) IMPLANT
CONV USE ITEM 321983 - GLOVE SURG 8 LTX CHEMO PF SMOOTH BEAD CUF STRL WHT 11.6IN PLMR THK.2MM THK.21MM (GLOVES AND ACCESSORIES) ×1 IMPLANT
CONV USE ITEM 323185 - PAD EG 15SQ IN UNIV FOAM SPLT NONCORD ADULT 9100 SER (SURGICAL CUTTING SUPPLIES) ×1 IMPLANT
CONV USE ITEM 329146 - CLEANER INSTR PREPZYME MUL-TRD CONTAINR NARSL NEUT PH BDGR 22OZ (MISCELLANEOUS PT CARE ITEMS) ×1 IMPLANT
CONV USE ITEM 338662 - PACK SURG ASCP STRL DISP ~~LOC~~ BPT MED CNTR LF (CUSTOM TRAYS & PACK) ×1 IMPLANT
CONV USE ITEM 34153 - ELECTRODE ESURG BLADE PNCL 3/32IN STRL SS CAUT PSHBTN STD SHAFT LF  VEGA SER (SURGICAL CUTTING SUPPLIES) ×1 IMPLANT
CONV USE ITEM 81225 - BAG BIOHAZ RD 30X24IN THK3 MIL C8-10GL LLDPE INFCT WASTE CAN PNCT RST (MED SURG SUPPLIES) ×2 IMPLANT
COUNTER 20 CNT BLOCK ADH NEEDLE STRL LF  RD SHARP FOAM 15.75X11.5X14IN DISP (MED SURG SUPPLIES) ×1 IMPLANT
COUNTER 20 CNT BLOCK ADH NEEDLE STRL LF RD SHARP FOAM 15.75 (MED SURG SUPPLIES) ×2
COVER TBL 90X50IN STD SMS REINF FNFLD STRL LF  DISP (DRAPE/PACKS/SHEETS/OR TOWEL) ×4 IMPLANT
COVER TBL 90X50IN STD SMS REINF FNFLD STRL LF DISP (DRAPE/PACKS/SHEETS/OR TOWEL) ×8
DEVICE SUCT 2 FILTER CHAMBER SCKR STRL LF  DISP (MED SURG SUPPLIES) ×2 IMPLANT
DEVICE SUT STRATAFIX PDS + 45C_M ABS KNTLS TISS CONTROL SMTR (SUTURE/WOUND CLOSURE) ×2
DRAPE 2 INCS FILM ANTIMIC 33X23IN IOBN STRL SURG (DRAPE/PACKS/SHEETS/OR TOWEL) ×1 IMPLANT
DRAPE 2 INCS FILM ANTIMIC 33X2_3IN IOBN STRL SURG (DRAPE/PACKS/SHEETS/OR TOWEL) ×2
DRAPE FNFLD ABS REINF 77X53IN 43528 PRXM LF  STRL DISP SURG SMS 44X23IN (DRAPE/PACKS/SHEETS/OR TOWEL) ×2 IMPLANT
DRAPE FNFLD ABS REINF 77X53IN_43528 PRXM LF STRL DISP SURG (DRAPE/PACKS/SHEETS/OR TOWEL) ×4
DURAPREP 26ML 8630 CS/20 (MED SURG SUPPLIES) ×2
ELECTRODE ESURG BLADE 6.5IN EX_T (CUTTING ELEMENTS) ×2
ELECTRODE ESURG BLADE 6IN 3/32IN SS LF (CUTTING ELEMENTS) ×1
ELECTRODE ESURG BLADE PNCL 3/32IN STRL SS CAUT PSHBTN STD (CUTTING ELEMENTS) ×2
GLOVE SURG 6 LF  PF SMOOTH BEAD CUF STRL GRN 12IN SENSICARE PI PLISPRN PLMR ALOE THK7.9 MIL DISP (GLOVES AND ACCESSORIES) IMPLANT
GLOVE SURG 6 LF PF SMOOTH BEAD CUF STRL GRN 12IN SENSICARE (GLOVES AND ACCESSORIES)
GLOVE SURG 6 LF PF SMOOTH STRL WHT PLISPRN (GLOVES AND ACCESSORIES)
GLOVE SURG 6.5 LF  PF BEAD CUF SMOOTH TXTR STRL GRN 12IN SENSICARE PLISPRN SYN PLMR ALOE THK7.9 MIL (GLOVES AND ACCESSORIES) IMPLANT
GLOVE SURG 6.5 LF PF BEAD CUF SMOOTH TXTR STRL GRN 12IN (GLOVES AND ACCESSORIES)
GLOVE SURG 6.5 LF PF SMOOTH STRL WHT PLISPRN (GLOVES AND ACCESSORIES)
GLOVE SURG 6.5 LTX PF BEAD CUF MICRO ROUGHEN N-PYRG STRL STRW BGL SRG CURVE FINGER (GLOVES AND ACCESSORIES) ×2 IMPLANT
GLOVE SURG 6.5 LTX PF BEAD CUF_MICRO RGH N-PYRG STRL STRW (GLOVES AND ACCESSORIES) ×4
GLOVE SURG 7 LF  PF SMOOTH TXTR BEAD CUF STRL GRN 12IN SENSICARE PI PLISPRN SYN PLMR ALOE THK7.9 MIL (GLOVES AND ACCESSORIES) ×2 IMPLANT
GLOVE SURG 7 LF  PF STRL PLISPRN DISP (GLOVES AND ACCESSORIES) ×1 IMPLANT
GLOVE SURG 7 LF PF SMOOTH STRL WHT PLISPRN (GLOVES AND ACCESSORIES) ×2
GLOVE SURG 7 LF PF SMOOTH TXTR BEAD CUF STRL GRN 12IN (GLOVES AND ACCESSORIES) ×4
GLOVE SURG 7 LTX PF SMOOTH STRL CRM (GLOVES AND ACCESSORIES)
GLOVE SURG 7.5 LF  PF SMOOTH BEAD CUF STRL GRN 12IN SENSICARE PI GRN PLISPRN PLMR ALOE THK7.9 MIL (GLOVES AND ACCESSORIES) ×3 IMPLANT
GLOVE SURG 7.5 LF PF SMOOTH BEAD CUF STRL GRN 12IN (GLOVES AND ACCESSORIES) ×6
GLOVE SURG 7.5 LF PF SMOOTH STRL WHT PLISPRN (GLOVES AND ACCESSORIES)
GLOVE SURG 7.5 LTX PF SMOOTH STRL CRM (GLOVES AND ACCESSORIES) ×2
GLOVE SURG 8 LF  BEAD CUF DERMASHIELD PLISPRN (GLOVES AND ACCESSORIES) IMPLANT
GLOVE SURG 8 LF  PF BEAD CUF SMOOTH TXTR STRL GRN 12IN SENSICARE PLISPRN SYN PLMR ALOE THK7.9 MIL (GLOVES AND ACCESSORIES) IMPLANT
GLOVE SURG 8 LF PF BEAD CUF SMOOTH TXTR STRL GRN 12IN (GLOVES AND ACCESSORIES)
GLOVE SURG 8 LF PF SMOOTH STRL WHT PLISPRN (GLOVES AND ACCESSORIES)
GLOVE SURG 8 LTX PF SMOOTH STRL CRM (GLOVES AND ACCESSORIES) ×2
GLOVE SURG 8.5 LF PF SMOOTH STRL WHT PLISPRN (GLOVES AND ACCESSORIES)
GLOVE SURG LF  PF STRL 7.5 PLISPRN DISP (GLOVES AND ACCESSORIES) IMPLANT
GOWN SURG XL STD LGTH L3 HKLP CLSR RGLN SLEEVE TWL STRL LF (DRAPE/PACKS/SHEETS/OR TOWEL) ×2
GOWN SURG XL STD LGTH L3 HKLP CLSR RGLN SLEEVE TWL STRL LF  DISP GRN AERO BLU PRFRM FBRC (DRAPE/PACKS/SHEETS/OR TOWEL) ×1 IMPLANT
HANDPC PLS LAV INTPLS SUCT FAN SPRAY TIP (MED SURG SUPPLIES) ×2
HDPE THK22 UM C40-45 GL L48 IN X W40 IN NATURAL (MISCELLANEOUS PT CARE ITEMS) ×2 IMPLANT
HEAD V40 36MM +0MM OFST TAPER HIP BLX D FEM STRL (IMPLANTS HIP) ×1 IMPLANT
HEMOSTAT ABS 14X2IN FLXB SHR W_V SRGCL STRL DISP (WOUND CARE SUPPLY) ×1 IMPLANT
HEMOSTAT ABS 14X2IN FLXB SHR W_V SRGCL STRL DISP (WOUND CARE/ENTEROSTOMAL SUPPLY) ×1
INSERT ACET TRIDENT 36MM 10D E X3 HIP (IMPLANTS HIP) ×1 IMPLANT
LABEL MED CORRECT MED LABELING SYS 4 FLG 2 SHEET 24 PRPRNT (MED SURG SUPPLIES) ×2
LABEL MED CORRECT MED LABELING SYS 4 FLG 2 SHEET 24 PRPRNT STRL (MED SURG SUPPLIES) ×1 IMPLANT
MATTRESS TRANSF 34IN HOVERMATT BRTHBL NONST LF  DISP (MED SURG SUPPLIES) ×1 IMPLANT
MATTRESS TRANSF 34IN HOVERMATT_BRTHBL NONST LF DISP (MED SURG SUPPLIES) ×2
NEEDLE HYPO 18GA 1.5IN STD RE_G BVL LF (MED SURG SUPPLIES) ×2
PACK ARTHROGRAM MDLN INDUSTRIES INC. RAD GEN N/M S DISP (CUSTOM TRAYS & PACK) ×2
PACK SURG ASCP STRL DISP ~~LOC~~ BPT MED CNTR LF (CUSTOM TRAYS & PACK) ×1
PAD EG 15SQ IN UNIV FOAM SPLT NONCORD ADULT 9100 SER (CUTTING ELEMENTS) ×2
SET INTPLS SUCT TUBE FAN SPRAY TIP HANDPC STRL LF  DISP (MED SURG SUPPLIES) ×1 IMPLANT
SHELL ACET 52MM HIP SLD BCK TRIT TRIDENT II E STRL (IMPLANTS HIP) ×1 IMPLANT
SOL IRRG 0.9% NACL 2000ML PRSV FR N-PYRG FLXB CONTAINR STRL (MEDICATIONS/SOLUTIONS) ×2
SOL IRRG 0.9% NACL 2000ML PRSV FR N-PYRG FLXB CONTAINR STRL LF (MEDICATIONS/SOLUTIONS) ×1 IMPLANT
SOL SURG PREP 26ML DRPRP 74% ISPRP 0.7% IOD POVACRYLEX SLF CNTN APPL SKIN STRL PREOP (MED SURG SUPPLIES) ×1 IMPLANT
SPONGE LAP 18X18IN PREWASH RIGID TRY STRL LF  WHT (MED SURG SUPPLIES) ×3 IMPLANT
SPONGE LAP 18X18IN PREWASH RIGID TRY STRL LF WHT (MED SURG SUPPLIES) ×6
STEM FEM CITATION 130MM PRFT TMZF HA HIP RGT 132D 4 44MM OFST 13MM (IMPLANTS HIP) ×1 IMPLANT
SUTURE 1 CT STRATAFIX PDS + 18IN VIOL ABS KNOTLESS TISS CONTROL SMTR ABS (SUTURE/WOUND CLOSURE) ×1 IMPLANT
SUTURE 1 GS-24 POLYSRB 36IN VIOL BRD COAT ABS (SUTURE/WOUND CLOSURE) ×4 IMPLANT
SUTURE 2-0 GS-21 POLYSRB 30IN UNDYED BRD COAT ABS (SUTURE/WOUND CLOSURE) ×4 IMPLANT
SUTURE 2-0 GS-21 POLYSRB_30IN BRD COAT ABS (SUTURE/WOUND CLOSURE) ×8
SUTURE 3-0 C-13 BIOSYN 30IN UNDYED MONOF ABS (SUTURE/WOUND CLOSURE) ×2 IMPLANT
SUTURE 4-0 C-14 SURGIPRO2 18IN BLU MONOF NONAB (SUTURE/WOUND CLOSURE) ×2 IMPLANT
SUTURE 5 KV-37 TICRON 30IN BLU BRD 5 STRN COAT NONAB (SUTURE/WOUND CLOSURE) ×2
SYRINGE LL 10ML LF  STRL GRAD N-PYRG DEHP-FR PVC FREE MED DISP (MED SURG SUPPLIES) ×1 IMPLANT
SYRINGE LL 10ML LF STRL MED D_ISP (MED SURG SUPPLIES) ×2
TOWEL 24X16IN COTTON BLU DISP SURG STRL LF (DRAPE/PACKS/SHEETS/OR TOWEL) ×8 IMPLANT
WOUND IRRG IRRISEPT DBRD CLNSG 0.05% CHG SYSTEM STRL LF (WOUND CARE SUPPLY) ×1 IMPLANT
WOUND IRRG IRRISEPT DBRD CLNSG_0.05% CHG SYSTEM STRL LF (WOUND CARE/ENTEROSTOMAL SUPPLY) ×2

## 2021-08-20 NOTE — Anesthesia Preprocedure Evaluation (Signed)
ANESTHESIA PRE-OP EVALUATION  Planned Procedure: RIGHT TOTAL HIP ARTHROPLASTY USING CITATION IMPLANTS (Right: Hip)  Review of Systems     anesthesia history negative     patient summary reviewed  nursing notes reviewed        Pulmonary  negative pulmonary ROS,    Cardiovascular    Hypertension, ECG reviewed and hyperlipidemia ,No peripheral edema,  Exercise Tolerance: > or = 4 METS        GI/Hepatic/Renal   negative GI/hepatic/renal ROS,         Endo/Other    osteoarthritis,      Neuro/Psych/MS   negative neuro/psych ROS,      Cancer    negative hematology/oncology ROS,                 Physical Assessment      Airway       Mallampati: II    TM distance: <3 FB    Mouth Opening: good.            Dental           (+) caps           Pulmonary    Breath sounds clear to auscultation  (-) no rhonchi, no decreased breath sounds, no wheezes, no rales and no stridor     Cardiovascular    Rhythm: regular  Rate: Normal  (-) no friction rub, carotid bruit is not present, no peripheral edema and no murmur     Other findings          Plan  ASA 2     Planned anesthesia type: general                       Intravenous induction     Anesthesia issues/risks discussed are: Dental Injuries, Stroke, Sore Throat and Cardiac Events/MI.  Anesthetic plan and risks discussed with patient             Patient's NPO status is appropriate for Anesthesia.           (Pt has has spinal in past. Does NOT want one today.)

## 2021-08-20 NOTE — PT Evaluation (Signed)
[facility]  [place], [address]  [phone]  [phone]  Rehabilitation Services  Physical Therapy Inpatient THA Initial Evaluation    Patient Name: Jerome Russell.  Date of Birth: [birth year]  Height: Height: 182.9 cm (6')  Weight: Weight: 90.7 kg (200 lb)  Room/Bed: 381/A  Payor: [employer] / Plan: [employer] OTHER / Product Type: Managed Care /       PMH:  Past Medical History:   Diagnosis Date    Hypercholesterolemia            Assessment:      (P) Patient is a 61 y/o male s/p Right THA. WBAT on RLE. Patient lives with his spouse in a [facility] with 1 step or ramp to entry. Spouse will provide 24/7 caregiver assist at d/c. PTA, patient was fully independent and denies daily AD use. Denies recent history of falls. At eval, pt completed bed mobility and sit-to-stand transition with Min A. Ambulated 180' utilizing RW with CGA and set-up required for IV pole mgmt. Patient with antalgic RLE gait demo'd by decrease step length and impaired weight acceptance on RLE. Verbal cues provided for sequencing AD and weight shifting strategies to facilitate reciprocal gait pattern. Did report increased pain and fatigue following ambulation trial; vitals WNL when assessed. Patient is appropriate for acute PT services. Initiated neuromuscular re-education and gait training. Recommend d/c home with 24/7 caregiver assist and use of RW as AD with no further PT needs if acute goals are met.    Discharge Needs:    Equipment Recommendation: (P) front wheeled walker      The patient presents with mobility limitations due to impaired range of motion, impaired strength, and impaired functional activity tolerance that significantly impair/prevent patient's ability to participate in mobility-related activities of daily living (MRADLs) including  ambulation and transfers in order to safely complete, toileting, bathing, food preparation, working, laundering/household  tasks, safely entering/exiting the home. This functional mobility deficit can be sufficiently resolved with the use of a (P) front wheeled walker  in order to decrease the risk of falls, morbidity, and mortality in performance of these MRADLs.  Patient is able to safely use this assistive device.    Discharge Disposition: (P) home with 24/7 assistance, TBD    JUSTIFICATION OF DISCHARGE RECOMMENDATION   Based on current diagnosis, functional performance prior to admission, and current functional performance, this patient requires continued PT services in (P) home with 24/7 assistance, TBD in order to achieve significant functional improvements in these deficit areas: (P) aerobic capacity/endurance, gait, locomotion, and balance, ROM (range of motion), muscle performance.    Plan:   Current Intervention: (P) balance training, bed mobility training, gait training, home exercise program, joint mobilization, manual therapy techniques, neuromuscular re-education, patient/family education, postural re-education, ROM (range of motion), stair training, strengthening, stretching, transfer training  To provide physical therapy services (P)  (1-3x/day Monday-Saturday)  for duration of (P) until discharge.    The risks/benefits of therapy have been discussed with the patient/caregiver and he/she is in agreement with the established plan of care.       Subjective & Objective     Past Medical History:   Diagnosis Date    Hypercholesterolemia             Past Surgical History:   Procedure Laterality Date    HX CHOLECYSTECTOMY      HX KNEE ARTHROSCOPY Bilateral     SHOULDER SURGERY  while in the [employer]          08/20/21 1501   Rehab Session   Document Type evaluation   Total PT Minutes: 32   Patient Effort good   Symptoms Noted During/After Treatment increased pain;fatigue   General Information   Patient Profile Reviewed yes   Medical Lines PIV Line;Telemetry   Respiratory Status room air   Existing Precautions/Restrictions  fall precautions;full code;other (see comments)  (hip precautions)   General Observations of Patient Alert and cooperative   Weight-bearing Status   Extremity Weight-bearing Status right lower extremity   Right Lower Extremity weight-bearing as tolerated (WBAT)   Mutuality/Individual Preferences   Patient-Specific Goals (Include Timeframe) "Decrease pain and get back to work."   Plan of Care Reviewed With patient;spouse   Living Environment   Lives With spouse   Living Arrangements house   Home Assessment: No Problems Identified   Home Accessibility no concerns   Living Environment Comment [place] with 1 step and/or ramp to entry.   Functional Level Prior   Ambulation 0 - independent   Transferring 0 - independent   Toileting 0 - independent   Bathing 0 - independent   Dressing 0 - independent   Eating 0 - independent   Communication 0 - understands/communicates without difficulty   Prior Functional Level Comment Fully independent; denies daily AD use   Pre Treatment Status   Pre Treatment Patient Status Patient supine in bed;Call light within reach;Telephone within reach   Support Present Pre Treatment  Other (See comments)  (Spouse present)   Communication Pre Treatment  Nurse   Communication Pre Treatment Comment LPN Jerome Russell cleared pt for PT eval   Cognitive Assessment/Interventions   Behavior/Mood Observations behavior appropriate to situation, WNL/WFL   Orientation Status oriented x 4   Vital Signs   Pre-Treatment Heart Rate (beats/min) 92   Post-treatment Heart Rate (beats/min) 83   Pre SpO2 (%) 93   O2 Delivery Pre Treatment room air   Post SpO2 (%) 97   O2 Delivery Post Treatment room air   Pain Assessment   Pretreatment Pain Rating 6/10   Posttreatment Pain Rating 6/10   Pre/Posttreatment Pain Comment 6/10 pain at right hip   RUE Assessment   RUE Assessment WFL- Within Functional Limits   LUE Assessment   LUE Assessment WFL- Within Functional Limits   RLE Assessment   RLE Assessment X-Exceptions   RLE Other  AROM limited by pain inhibition; 4+/5 distally at plantar/dorsiflexors   LLE Assessment   LLE Assessment WFL- Within Functional Limits   Trunk Assessment   Trunk Assessment WFL-Within Functional Limits   Bed Mobility Assessment/Treatment   Bed Mobility, Assistive Device overhead trapeze;Head of Bed Elevated   Supine-Sit Independence minimum assist (75% patient effort)   Sit to Supine, Independence minimum assist (75% patient effort)   Safety Issues decreased use of legs for bridging/pushing   Impairments pain;ROM decreased;strength decreased   Transfer Assessment/Treatment   Sit-Stand Independence minimum assist (75% patient effort)   Stand-Sit Independence minimum assist (75% patient effort)   Sit-Stand-Sit, Assist Device walker, front wheeled   Bed-Chair Independence contact guard assist   Bed-Chair-Bed Assist Device walker, front wheeled   Transfer Safety Issues weight-shifting ability decreased   Transfer Impairments pain;ROM decreased;strength decreased   Gait Assessment/Treatment   Total Distance Ambulated 180   Independence  contact guard assist;set up required   Assistive Device  walker, front wheeled   Distance in Feet 180'   Gait Speed functional  Deviations  weight-shifting ability decreased   Safety Issues  weight-shifting ability decreased   Impairments  endurance;pain;ROM decreased;strength decreased   Balance Skill Training   Sitting Balance: Static good balance   Sitting, Dynamic (Balance) good balance   Sit-to-Stand Balance fair + balance   Standing Balance: Static fair + balance   Standing Balance: Dynamic fair + balance   Systems Impairment Contributing to Balance Disturbance musculoskeletal;neuromuscular   Identified Impairments Contributing to Balance Disturbance pain;decreased ROM;decreased strength   Neuromuscular Re-education   Comment, Neuromuscular Re-education Instructed patient in performance of standing medial/lateral weight-shifts (x1 min) to improve dynamic balance and facilitate  weight acceptance on RLE for carryover into gait. Patient also instructed in perfomance of standing reciprocal marching (x1 min) to improve dynamic balance and increase single limb stability for carryover into gait. Verbal cues and visual demonstration provided. Standing rest breaks provided between trials. Pt reported mild inc pain with weight bearing on RLE; vitals remained WNL.   Post Treatment Status   Post Treatment Patient Status Call light within reach;Telephone within reach;Patient sitting in bedside chair or w/c   Support Present Post Treatment  Other (See comments)  (Spouse present)   Communication Post Treatement Nurse   Communication Post Treatment Comment Patient seated in bed-side chair with all expressed needs met. Call bell within reach, chair alarm ON. Spouse present.   Plan of Care Review   Plan Of Care Reviewed With patient;spouse   Functional Impairment   Overall Functional Impairments/Problem List endurance;pain;ROM decreased;strength decreased   Physical Therapy Clinical Impression   Assessment Patient is a 61 y/o male s/p Right THA. WBAT on RLE. Patient lives with his spouse in a [facility] with 1 step or ramp to entry. Spouse will provide 24/7 caregiver assist at d/c. PTA, patient was fully independent and denies daily AD use. Denies recent history of falls. At eval, pt completed bed mobility and sit-to-stand transition with Min A. Ambulated 180' utilizing RW with CGA and set-up required for IV pole mgmt. Patient with antalgic RLE gait demo'd by decrease step length and impaired weight acceptance on RLE. Verbal cues provided for sequencing AD and weight shifting strategies to facilitate reciprocal gait pattern. Did report increased pain and fatigue following ambulation trial; vitals WNL when assessed. Patient is appropriate for acute PT services. Initiated neuromuscular re-education and gait training. Recommend d/c home with 24/7 caregiver assist and use of RW as AD with no further PT needs if acute  goals are met.   Criteria for Skilled Therapeutic skilled treatment is necessary   Pathology/Pathophysiology Noted musculoskeletal;neuromuscular   Impairments Found (describe specific impairments) aerobic capacity/endurance;gait, locomotion, and balance;ROM (range of motion);muscle performance   Functional Limitations in Following  self-care;home management;work;community/leisure   Rehab Potential good   Therapy Frequency   (1-3x/day Monday-Saturday)   Predicted Duration of Therapy Intervention (days/wks) until discharge   Anticipated Equipment Needs at Discharge (PT) front wheeled walker   Anticipated Discharge Disposition home with 24/7 assistance;TBD   Evaluation Complexity Justification   Clinical Decision Making Moderate complexity   Evaluation Complexity Moderate complexity   Planned Therapy Interventions, PT Eval   Planned Therapy Interventions (PT) balance training;bed mobility training;gait training;home exercise program;joint mobilization;manual therapy techniques;neuromuscular re-education;patient/family education;postural re-education;ROM (range of motion);stair training;strengthening;stretching;transfer training   Psychosocial Support   Trust Relationship/Rapport care explained;emotional support provided   (INSERT FLOWSHEET)    TOTAL HIP ARTHROPLASTY (THA) GOALS    1) AMBULATE 300 FEET WITH WALKER AND Modified Independence.  2) TRANSFER BED TO/FROM CHAIR WITH Modified  Independence WHILE MAINTAINING THA PRECAUTIONS.Marland Kitchen   3) NEGOTIATES TRAINING STEPS USING HANDRAIL AND Modified Independence.       Physical Therapy Inpatient THA D/C Criteria        PATIENT/CAREGIVER EDUCATION  Educated on exercise program? NO  Can successfully demonstrate exercise form? NO  Can demonstrate safe/effective assistance with functional mobility? NO    IMPAIRMENT BASED CRITERIA  Can the patient verbalize THA precautions including no leg crossing, no bending > 90 degrees and no twisting? YES  Does the patient have adequate pain  control of <4/10 for functional mobility at home? NO    FUNCTIONAL BASED CRITERIA  Does that patient demonstrate the ability to ambulate 80 feet with RW using CGA/SPV assistance? YES  Does the patient demonstrate the ability to perform bed mobility with no more than min assist? YES  Does the patient demonstrated the ability to sit to stand from the bed or chair with min assist? YES  Does the patient demonstrate the ability to walk up/down 3-5 stairs with min assist as required for home entry? NO             INTERVENTION MINUTES: EVALUATION 16 minutes, GAIT TRAINING 7 MINUTES, and NEURO RE-EDUCATION 9MINUTES    EVALUATION COMPLEXITY : CLINICAL DECISION MAKING OF MODERATE COMPLEXITY AS INDICATED BY PMHX, PHYSICAL THERAPY ASSESSMENT OF MUSCULOSKELETAL AND NEUROLOGICAL SYSTEMS AND ACTIVITY LIMITATIONS. CLINICAL PRESENTATION HAS CHANGING CHARACTERISTICS.    Therapist:     Ferol Luz, PT  08/20/2021, 16:23  Maleeya Peterkin Neldon Labella, PT 08/20/2021 16:23

## 2021-08-20 NOTE — Interval H&P Note (Signed)
H & P updated the day of the procedure.  1.  H&P completed within 30 days of surgical procedure and has been reviewed within 24 hours of admission but prior to surgery or a procedure requiring anesthesia services, the patient has been examined, and no change has occured in the patients condition since the H&P was completed.       Change in medications: No        No LMP recorded.      Comments:     2.  Patient continues to be appropriate candidate for planned surgical procedure. YES    Truman Aceituno, DO

## 2021-08-20 NOTE — Care Plan (Signed)
Problem: Adult Inpatient Plan of Care  Goal: Patient-Specific Goal (Individualized)  Outcome: Ongoing (see interventions/notes)  Flowsheets (Taken 08/20/2021 1417)  Individualized Care Needs: ADL assist while recovering from surgery, pain control.  Anxieties, Fears or Concerns: Anxious about working with therapy after surgery.  Patient-Specific Goals (Include Timeframe): Wants to go home tomorrow after working with physical therapy.  Plan of Care Reviewed With: patient  Patient continues to receive PRN and scheduled medications, IV fluids, physical therapy, patient education regarding fall risk prevention and recent surgery, and discharge planning remains ongoing.

## 2021-08-20 NOTE — OR Surgeon (Signed)
[facility]   OPERATIVE REPORT  PATIENT NAME:  Parrish, Bonn.  MRN:  [id number]  DOB:  Oct 26, 1960      DATE OF PROCEDURE:  08/20/2021   PRE OP DIAGNOSIS: PRIMARY OSTEOARTHRITIS RIGHT HIP  POST OP DIAGNOSIS: Same  PROCEDURE:  Right  Total hip arthroplasty  COMPONENTS:   Implant Name Type Inv. Item Serial No. Manufacturer Lot No. LRB No. Used Action   SHELL ACET HIP SLD BCK TRIT TRIDENT II E STRL - [id number]  SHELL ACET HIP SLD BCK TRIT TRIDENT II E STRL  [employer] [id number] Right 1 Implanted   INSERT ACET TRIDENT 10D E X3 HIP - [id number]  INSERT ACET TRIDENT 10D E X3 HIP  [employer] [id number] Right 1 Implanted   STEM FEM CITATION PRFT TMZF HA HIP RGT 132D 4 - [id number]  STEM FEM CITATION PRFT TMZF HA HIP RGT 132D 4  [employer] [id number] Right 1 Implanted   HEAD V40 +0MM OFST TAPER_HIP BLX D FEM STRL - [id number]  HEAD V40 +0MM OFST TAPER_HIP Arnetha Gula FEM STRL  [employer] [id number] Right 1 Implanted       MANUFACTURER:  [employer] ( default)  Other:  ANESTHESIA : General   SURGEON: Surgeon(s):  Quenton Fetter, DO   ASSISTING SURGEON: Surgeon(s) and Role:     Quenton Fetter, DO - Primary   ANTIBIOTIC: Kefzol  ANTICOAGULATION: aspirin  FIRST ASSISTANT: PBfirstassist: Johnnie Blankenship  ESTIMATED BLOOD LOSS:300 ml  SPECIMEN: * No specimens in log *   DESCRIPTION OF THE PROCEDURE:  DESCRIPTION OF THE PROCEDURE:  Side and site were identified with consent and marking on patient.  Antibiotics and TXA were administered prior to incision.  Prep and draping were performed in the lateral decubitus position.   Posterolateral approach to hip was completed.  Short external rotators were divided and retracted to protect the sciatic nerve.   The hip was dislocated, and the femoral neck was marked using the probe at the osteotomy site.  Femur osteotomized at appropriate length and orientation.  The acetabulum was exposed.  Sequential  reaming performed. The acetabulum was impacted in appropriate orientation .  Depth of seating was verified verified through the central hole.  The hole was capped with the screw in plug.  The polyethylene liner was seated.  Orientation and fixation of the shell and poly was verified.  The femoral side was exposed.  The trochanter was opened with trochanteric gouge.  The femur was reamed and broached to the above noted size.  Trial reduction was performed and found to be stable through fully obtainable range.  Trial reduction was performed, and leg lengths evaluated and found to match preoperative plan.  Hip was put through full range of motion with excellent stability.  The hip was dislocated and provisionals were removed.  The final components were seated.  The femoral head was reduced on to a dry trunnion, and the hip was reduced.  Final testing revealed excellent stability and appropriate soft tissue tension.    The short external rotators were reattached to their trochanteric insertions using #5 ethibond suture.  The wound was closed in layers finishing with subcuticular stitch.  Patient was taken to recovery under the supervision of anesthesia.    Quenton Fetter, DO  08/20/2021, 12:03   This note was partially generated using MModal Fluency Direct system, and there may be some incorrect words, spellings, and punctuation that were not  noted in checking the note before saving.

## 2021-08-20 NOTE — Anesthesia Procedure Notes (Signed)
[facility].    Airway Note  General Information and Staff   Authorizing provider: Bobette Mo, CRNA  Performing provider: Bobette Mo, CRNA        Urgency: elective    Airway not difficult    Indications and Patient Condition  Pt location: In Or  Indications for airway management: anesthesia  Spontaneous ventilation: present  Sedation level: deep  Preoxygenated: yes  Patient position: sniffing  Mask difficulty assessment: 2 - vent by mask + OA or adjuvant +/- NMBA        Final Airway Details  Final airway type: endotracheal airway        Successful airway: ETT     Successful intubation technique: direct laryngoscopy  Facilitating devices/methods: cricoid pressure              Blade: Macintosh  Blade size: #3  Airway size (mm): 8.0  Cormack-Lehane Classification: grade I - full view of glottis  Placement verified by: chest auscultation and capnometry   Marked at 23  Measured from: lips  Secured with: Tape  Number of attempts at approach: 1Airway complications: Atraumatic

## 2021-08-20 NOTE — Anesthesia Postprocedure Evaluation (Signed)
Anesthesia Post Op Evaluation    Patient: Jerome Russell.  Procedure(s):  RIGHT TOTAL HIP ARTHROPLASTY USING NONCEMENTED CITATION IMPLANTS    Last Vitals:Temperature: 37 C (98.6 F) (08/20/21 0745)  Heart Rate: 92 (08/20/21 1248)  BP (Non-Invasive): (!) 156/89 (08/20/21 1248)  Respiratory Rate: 19 (08/20/21 1248)  SpO2: 93 % (08/20/21 1248)    No notable events documented.    Patient is sufficiently recovered from the effects of anesthesia to participate in the evaluation and has returned to their pre-procedure level.  Patient location during evaluation: PACU       Patient participation: complete - patient participated  Level of consciousness: awake and alert and responsive to verbal stimuli    Pain management: adequate  Airway patency: patent    Anesthetic complications: no  Cardiovascular status: acceptable  Respiratory status: acceptable  Hydration status: acceptable  Patient post-procedure temperature: Pt Normothermic   PONV Status: Absent

## 2021-08-20 NOTE — Anesthesia Transfer of Care (Signed)
ANESTHESIA TRANSFER OF CARE   Jerome Russell. is a 61 y.o. ,male, Weight: 90.7 kg (200 lb)   had Procedure(s):  RIGHT TOTAL HIP ARTHROPLASTY USING NONCEMENTED CITATION IMPLANTS  performed  08/20/21   Primary Service: Quenton Fetter, DO    Past Medical History:   Diagnosis Date    Hypercholesterolemia       Allergy History as of 08/20/21       OXYCODONE HCL         Noted Status Severity Type Reaction    08/20/21 0744 Antonieta Loveless, LPN 47/82/95 Active Low  Nausea/ Vomiting    Comments: Pt verbalized never had any problems.     08/20/21 0732 Duffy, Misty Stanley, CPhT 11/13/10 Active Low  Nausea/ Vomiting              CODEINE         Noted Status Severity Type Reaction    08/20/21 0744 Antonieta Loveless, LPN 62/13/08 Active Low  Nausea/ Vomiting                  I completed my transfer of care / handoff to the receiving personnel during which we discussed:  Access, All key/critical aspects of case discussed, Antibiotics, Fluids/Product, Labs, PMHx, Gave opportunity for questions and acknowledgement of understanding, Expectation of post procedure, Analgesia and Airway      Post Location: PACU                        Additional Info:Jerome Russell. is a 61 y.o.  transported to PACU post operatively in stable condition.  All questions were answered for the PACU team and signout was given to the PACU nurse, including but not limited to, significant past medical history, allergies, clinical course during the surgery and post-operative considerations.    Tresa Moore, DO  Anesthesiology  08/20/21                                          Last OR Temp: Temperature: 37 C (98.6 F)  ABG:  POTASSIUM   Date Value Ref Range Status   08/14/2021 4.6 3.5 - 5.1 mmol/L Final     KETONES   Date Value Ref Range Status   08/14/2021 Negative Negative, Trace mg/dL Final     CALCIUM   Date Value Ref Range Status   08/14/2021 9.7 8.6 - 10.3 mg/dL Final     Airway:* No LDAs found *  Blood pressure (!) 174/93, pulse 97, temperature 37 C (98.6  F), resp. rate (!) 30, height 1.829 m (6'), weight 90.7 kg (200 lb), SpO2 98 %.

## 2021-08-20 NOTE — H&P (Signed)
Paper H and P on chart, will be scanned to EMR.

## 2021-08-21 LAB — CBC WITH DIFF
BASOPHIL #: 0 10*3/uL (ref 0.00–0.30)
BASOPHIL %: 0 % (ref 0–3)
EOSINOPHIL #: 0 10*3/uL (ref 0.00–0.80)
EOSINOPHIL %: 0 % (ref 0–7)
HCT: 39.5 % — ABNORMAL LOW (ref 42.0–51.0)
HGB: 13.2 g/dL — ABNORMAL LOW (ref 13.5–18.0)
LYMPHOCYTE #: 0.8 10*3/uL — ABNORMAL LOW (ref 1.10–5.00)
LYMPHOCYTE %: 8 % — ABNORMAL LOW (ref 25–45)
MCH: 29.3 pg (ref 27.0–32.0)
MCHC: 33.5 g/dL (ref 32.0–36.0)
MCV: 87.2 fL (ref 78.0–99.0)
MONOCYTE #: 0.6 10*3/uL (ref 0.00–1.30)
MONOCYTE %: 6 % (ref 0–12)
MPV: 8.6 fL (ref 7.4–10.4)
NEUTROPHIL #: 8.1 10*3/uL (ref 1.80–8.40)
NEUTROPHIL %: 85 % — ABNORMAL HIGH (ref 40–76)
PLATELETS: 200 10*3/uL (ref 140–440)
RBC: 4.53 10*6/uL (ref 4.20–6.00)
RDW: 14 % (ref 11.6–14.8)
WBC: 9.5 10*3/uL (ref 4.0–10.5)
WBCS UNCORRECTED: 9.5 10*3/uL

## 2021-08-21 LAB — BASIC METABOLIC PANEL
ANION GAP: 10 mmol/L (ref 10–20)
BUN/CREA RATIO: 25 — ABNORMAL HIGH (ref 6–22)
BUN: 21 mg/dL (ref 7–25)
CALCIUM: 9.2 mg/dL (ref 8.6–10.3)
CHLORIDE: 100 mmol/L (ref 98–107)
CO2 TOTAL: 25 mmol/L (ref 21–31)
CREATININE: 0.85 mg/dL (ref 0.60–1.30)
ESTIMATED GFR: 99 mL/min/{1.73_m2} (ref >59)
GLUCOSE: 136 mg/dL — ABNORMAL HIGH (ref 74–109)
OSMOLALITY, CALCULATED: 275 mOsm/kg (ref 270–290)
POTASSIUM: 4.9 mmol/L (ref 3.5–5.1)
SODIUM: 135 mmol/L — ABNORMAL LOW (ref 136–145)

## 2021-08-21 MED ORDER — BISACODYL 5 MG TABLET,DELAYED RELEASE
5.0000 mg | DELAYED_RELEASE_TABLET | Freq: Once | ORAL | Status: DC
Start: 2021-08-21 — End: 2021-08-21

## 2021-08-21 MED ORDER — HYDROCODONE 7.5 MG-ACETAMINOPHEN 325 MG TABLET
1.0000 | ORAL_TABLET | Freq: Four times a day (QID) | ORAL | 0 refills | Status: AC | PRN
Start: 2021-08-21 — End: ?

## 2021-08-21 NOTE — Progress Notes (Signed)
Procedure(s), Procedure Date(s) and Post Op Day:    Procedure(s):  RIGHT TOTAL HIP ARTHROPLASTY USING NONCEMENTED CITATION IMPLANTS    08/20/2021    1 Day Post-Op  -------------------   Quenton Fetter, DO  Phone Number: [phone]   Weight-bearing Status  Extremity Weight-bearing Status: right lower extremity  Right Lower Extremity: weight-bearing as tolerated (WBAT)   Gait Assessment/Treatment  Total Distance Ambulated: 340  Independence : stand-by assistance  Assistive Device : walker, front wheeled  Distance in Feet: 180'  Gait Speed: functional  Deviations : weight-shifting ability decreased  Safety Issues : weight-shifting ability decreased  Impairments : endurance, pain  Comment: good step thru gait  Anticoagulants (last 24 hours)       None           Patient Lines/Drains/Airways Status       Active Line / Dialysis Catheter / Dialysis Graft / Drain / Airway / Wound       Name Placement date Placement time Site Days    Peripheral IV Left;Proximal Basilic  (medial side of arm) 08/20/21  0746  -- 1    Surgical Incision Right Hip 08/20/21  1117  -- 1                   CBC:     9.5 (08/02 0500) \   13.2* (08/02 0500) /   200 (08/02 0500)      / 39.5* (08/02 0500) \           BMP:   135* (08/02 0500) 100 (08/02 0500) 21 (08/02 0500)    /     136* (08/02 0500)   4.9 (08/02 0500) 25 (08/02 0500) 0.85 (08/02 0500) \         Meets DC criteria  No evidence of DVT.  ASA for DVT prophy  Rx N7.5 with instruction on proper use of Rx.

## 2021-08-21 NOTE — Nurses Notes (Signed)
Nozin given to patient.

## 2021-08-21 NOTE — OT Treatment (Signed)
[facility]  [address]  [place], [address]  [phone]  (Fax) [phone]  Rehabilitation Department     Occupational Therapy Daily Inpatient Note    Date: 08/21/2021  Patient's Name: Jerome Russell.  Date of Birth: 1960/12/20  Height: Height: 182.9 cm (6')  Weight: Weight: 90.7 kg (200 lb)        Plan: Will continue under current POC.         Subjective/Objective/Assessment:      08/21/21 0945   Rehab Session   Document Type therapy progress note (daily note)   Total OT Minutes: 20   Patient Effort good   Symptoms Noted During/After Treatment none   General Information   Patient Profile Reviewed yes   Medical Lines Telemetry   Respiratory Status room air   Existing Precautions/Restrictions posterior hip precautions;fall precautions   Pre Treatment Status   Pre Treatment Patient Status Patient sitting on edge of bed;Call light within reach;Patient safety alarm activated;Nurse approved session   Support Present Pre Treatment  Family present   Communication Pre Treatment  Charge Nurse   Vital Signs   Vitals Comment stable   Pain Assessment   Pre/Posttreatment Pain Comment no pain reported   Cognitive Assessment/Interventions   Behavior/Mood Observations behavior appropriate to situation, WNL/WFL;restless   Orientation Status oriented x 4   Attention WNL/WFL   Follows Commands WNL   Bed Mobility Assessment/Treatment   Sit to Supine, Independence stand-by assistance;verbal cues required   Safety Issues decreased use of legs for bridging/pushing   Transfer Assessment/Treatment   Sit-Stand Independence stand-by assistance   Stand-Sit Independence stand-by assistance   Lower Body Dressing Assessment/Training   Assistive Devices reacher;sock-aid   Position sitting;standing   DRESSING ASSESSED Don Pants- fasten;Sheldon Silvan;Doff Pants- fasten;Doff Socks   Independence Level  verbal cues required;standby assist   Post Treatment Status   Post Treatment Patient Status Patient supine  in bed;Call light within reach;Patient safety alarm activated   Support Present Post Treatment  Family present;Nurse present   Clinical Impression   Functional Level at Time of Session Patient educated on sock aid, reacher, shoe horn, long handled sponge. patient able to retrun demo of sock aid reacher, use fo dress LB with verbal cues and standby A for safety. patient able to demonstrate and tell what long handled sponge, and shoe horn are used for and how. Patient completed sit to stand and stand to sit with standby A for safety. Patient completed sit to supine with min verbal cues.                 Intervention minutes: ADL/IADL TRAINING 20 minutes      THERAPIST  Christ Kick, COTA  08/21/2021, 14:39

## 2021-08-21 NOTE — PT Treatment (Signed)
[facility]  [address]  [place], [address]  [phone]  (Fax) [phone]  Rehabilitation Department  Physical Therapy Daily Inpatient THA Note    Date: 08/21/2021  Patient's Name: Jerome Russell.  Date of Birth: 1960-11-16  Height: Height: 182.9 cm (6')  Weight: Weight: 90.7 kg (200 lb)      Plan: Will continue under current POC.         Subjective/Objective/Assessment:  Flowsheet    08/21/21 0846   Rehab Session   Document Type therapy progress note (daily note)   Total PT Minutes: 39   Patient Effort excellent   General Information   Patient Profile Reviewed yes   Respiratory Status room air   Existing Precautions/Restrictions fall precautions;posterior hip precautions   Weight-bearing Status   Extremity Weight-bearing Status right lower extremity   Right Lower Extremity weight-bearing as tolerated (WBAT)   Pre Treatment Status   Pre Treatment Patient Status Patient standing at bedside   Communication Pre Treatment  Charge Nurse   Cognitive Assessment/Interventions   Behavior/Mood Observations behavior appropriate to situation, WNL/WFL   Orientation Status oriented x 4   Vital Signs   Vitals Comment stable   Pain Assessment   Pain Intervention  PRN Medication   Pretreatment Pain Rating 3/10   Posttreatment Pain Rating 3/10   Transfer Assessment/Treatment   Sit-Stand Independence stand-by assistance   Stand-Sit Independence stand-by assistance   Sit-Stand-Sit, Assist Device walker, front wheeled   Gait Assessment/Treatment   Total Distance Ambulated 340   Independence  stand-by assistance   Assistive Device  walker, front wheeled   Impairments  endurance;pain   Comment good step thru gait   Stairs Assessment/Treatment   Number of Stairs 4   Handrail Location both sides   Comment sba   Balance Skill Training   Sitting Balance: Static good balance   Sitting, Dynamic (Balance) good balance   Sit-to-Stand Balance fair + balance   Standing Balance: Static fair + balance    Therapeutic Exercise/Activity   Comment patient particpated with group therapy, did well with exs, reviewed walking, hip precautions, car transfers, wife present during session   Post Treatment Status   Post Treatment Patient Status Patient sitting in bedside chair or w/c   Support Present Post Treatment  Family present   Communication Ambulance person Nurse   Physical Therapy Clinical Impression   Assessment patient gaited with rw 338ft, good step thru gait, 4 steps, did well with ex program, wife present questions answered for home               Physical Therapy Inpatient THA D/C Criteria        PATIENT/CAREGIVER EDUCATION  Educated on exercise program? YES  Can successfully demonstrate exercise form? YES  Can demonstrate safe/effective assistance with functional mobility? YES    IMPAIRMENT BASED CRITERIA  Can the patient verbalize THA precautions including no leg crossing, no bending > 90 degrees and no twisting? YES  Does the patient have adequate pain control of <4/10 for functional mobility at home? YES    FUNCTIONAL BASED CRITERIA  Does that patient demonstrate the ability to ambulate 80 feet with RW using CGA/SPV assistance? YES  Does the patient demonstrate the ability to perform bed mobility with no more than min assist? YES  Does the patient demonstrated the ability to sit to stand from the bed or chair with min assist? YES  Does the patient demonstrate the ability to walk up/down  3-5 stairs with min assist as required for home entry? YES        THERAPIST  Lane Hacker, PTA  08/21/2021, 12:07         Intervention minutes: GROUP THERAPEUTIC EXERCISE 30 MINUTES and GAIT TRAINING 9 MINUTES    THERAPIST  Lane Hacker, PTA  08/21/2021, 12:07

## 2021-08-21 NOTE — Care Management Notes (Signed)
[facility] Management Initial Evaluation    Patient Name: Jerome Russell.  Date of Birth: Oct 11, 1960  Sex: male  Date/Time of Admission: 08/20/2021  7:24 AM  Room/Bed: 381/A  [place] [employer] [employer] [place] / Plan: VACCN/OPTUM OTHER / Product Type: Managed Care /   Primary Care Providers:  Pcp, No (General)    Pharmacy Info:   Preferred Pharmacy       [place], [place] - [address]    [address] [address]    Phone: [phone] Fax: [phone]    Hours: Not open 24 hours          Emergency Contact Info:   Extended Emergency Contact Information  Primary Emergency Contact: [place]  Mobile Phone: [phone]  Relation: Wife  Preferred language: English  Interpreter needed? No    History:   Jerome Russell. is a 61 y.o., male, admitted 08/20/21.    Height/Weight: 182.9 cm (6') / 90.7 kg (200 lb)     LOS: 0 days   Admitting Diagnosis: Status post total hip replacement, right [C16.606]    Assessment:      08/21/21 1034   Assessment Details   Assessment Type Admission   Date of Care Management Update 08/21/21   Date of Next DCP Update 08/21/21   Insurance Information/Type   Insurance type   [facility])   Employment/Financial   Patient has Prescription Coverage?  Yes        Name of Insurance Coverage for Medications Walker an age group to open "lives with" row.  Adult   Lives With spouse   Living Arrangements house   Able to Return to Prior Arrangements yes   IEP and/or 504 Plan? No   Home Safety   Home Assessment: No Problems Identified   Home Accessibility bed and bath on same level;stairs to enter home   Custody and Legal Status   Do you have a court appointed guardian/conservator? No   Are you an emancipated minor? No   Custody Issues? No   Paternity Affidavit Requested? No   Care Management Plan   Discharge Planning Status initial meeting   Projected Discharge Date 08/21/21   Discharge  plan discussed with: Patient   CM will evaluate for rehabilitation potential no   Patient aware of possible cost for ambulance transport?  No   Discharge Needs Assessment   Equipment Currently Used at [facility] commode;walker, front wheeled   Equipment Needed After Discharge none   Discharge Facility/Level of Care Needs Home (Patient/Family Member/other)(code 1)   Transportation Available car;family or friend will provide   Referral Information   Admission Type observation   Arrived From home or self-care   Home Main Entrance   Number of Stairs, Main Entrance one     Pt admitted with total right hip arthroplasty. CM met with pt at bedside. Pt was alert and oriented to all spheres. Pt lives with his wife and plans to return there upon discharge. Pt has 1 step to get into his home and then it is all one level. Pt will have his wife, Jerome Russell, to assist in his care. Pt has a walker and a bedside commode. Pt will not have to have outpatient physical therapy.      Discharge Plan:  Home (Patient/Family Member/other) (code 1)      The patient will continue  to be evaluated for developing discharge needs.     Case Manager: Doroteo Bradford, [place]  Phone: [phone]

## 2021-08-22 NOTE — OT Evaluation (Signed)
[facility]  [place], [address]  [phone]  [phone]  Rehabilitation Services  Occupational Therapy Inpatient Initial Evaluation      Patient Name: Jerome Russell.  Date of Birth: 09/23/1960  Height: Height: 182.9 cm (6')  Weight: Weight: 90.7 kg (200 lb)  Room/Bed: 381/A  Payor: [facility] [employer] / Plan: [employer]AC[employer]/OPTUM OTHER / Product Type: Managed Care /         PMH:   Past Medical History:   Diagnosis Date    Hypercholesterolemia            Assessment:      Functional Level at Time of Session: Patient educated on sock aid, reacher, shoe horn, long handled sponge. patient able to retrun demo of sock aid reacher, use fo dress LB with verbal cues and standby A for safety. patient able to demonstrate and tell what long handled sponge, and shoe horn are used for and how. Patient completed sit to stand and stand to sit with standby A for safety. Patient completed sit to supine with min verbal cues.    Discharge Needs:   Equipment Recommendation:      The patient presents with mobility limitations due to impaired balance that significantly impair/prevent patient's ability to participate in mobility-related activities of daily living (MRADLs) including  ambulation and transfers in order to safely complete, toileting, bathing, working, safely entering/exiting the home. This functional mobility deficit can be sufficiently resolved with the use of a Anticipated Equipment Needs at Discharge: (P) none anticipated  in order to decrease the risk of falls, morbidity, and mortality in performance of these MRADLs.  Patient is able to safely use this assistive device.    Discharge Disposition:      JUSTIFICATION OF DISCHARGE RECOMMENDATION   Based on current diagnosis, functional performance prior to admission, and current functional performance, this patient requires continued OT services in Anticipated Discharge Disposition: (P) home with assist  in order to  achieve significant functional improvements.    Plan:   Current Intervention:  Predicted Duration of Therapy: (P) until discharge    To provide Occupational therapy services  Therapy Frequency: (P) 1x/day for duration of Predicted Duration of Therapy: (P) until discharge  .       The risks/benefits of therapy have been discussed with the patient/caregiver and he/she is in agreement with the established plan of care.       Subjective & Objective     MEDICAL HISTORY:   Past Medical History:   Diagnosis Date    Hypercholesterolemia          SURGICAL HISTORY:   Past Surgical History:   Procedure Laterality Date    HX CHOLECYSTECTOMY      HX KNEE ARTHROSCOPY Bilateral     SHOULDER SURGERY      while in the [employer]       ipp    INSERT FLOW SHEET     08/21/21 0930   Rehab Session   Document Type evaluation   Total OT Minutes: 15   Patient Effort good   General Information   Patient Profile Reviewed yes   Respiratory Status room air   Existing Precautions/Restrictions fall precautions;posterior hip precautions   Pre Treatment Status   Pre Treatment Patient Status Patient supine in bed;Call light within reach;Telephone within reach;Patient safety alarm activated;Nurse approved session   Support Present Pre Treatment  Family present   Communication Pre Treatment  Charge  Nurse   Communication Pre Treatment Comment No complications postop surgery in physical therapy class this morning.  Scheduled for discharge home today post OT evaluation and follow-up treatment with C ota/L   Mutuality/Individual Preferences   Anxieties, Fears or Concerns Ready to go home   Plan of Care Reviewed With patient;spouse   [place] Patient lives in [place], [place] with his spouse, Susie in a 1 level home that includes a basement.  One step to enter MMF a, no railing.  Ten steps with 2 rails to access basement which patient reports he will not need to do when he 1st goes home.  Patient has tub/shower  combo, tub bench, BSC   Functional Level Prior   Ambulation 0 - independent   Transferring 0 - independent   Toileting 0 - independent   Bathing 0 - independent   Dressing 0 - independent   Eating 0 - independent   Communication 0 - understands/communicates without difficulty   Swallowing 0-->swallows foods/liquids without difficulty   Prior Functional Level Comment Patient reports that he drives, wears reading glasses as needed, still works and is self-employed   Self-Care   Dominant Hand right   Usual Activity Tolerance excellent   Current Activity Tolerance good   Important Activities exercise;family;hobbies;social;spiritual   Vital Signs   Vitals Comment Stable   Pain Assessment   Pre/Posttreatment Pain Comment Patient reported general soreness but no pain during/post evaluation   Pain Intervention  PRN Medication   Coping/Psychosocial   Observed Emotional State calm;cooperative;pleasant;restless   Verbalized Emotional State acceptance   Family/Support System   Family/Support Persons spouse   Involvement in Care at bedside;attentive to patient;interacting with patient;participating in care;supportive of patient   Cognitive Assessment/Interventions   Behavior/Mood Observations behavior appropriate to situation, WNL/WFL   Orientation Status oriented x 4   Attention WNL/WFL   Follows Commands WFL   Comment Required extra time, repetition of cues for adherence to hip precautions   Vision Assessment/Interventions   Visual Impairment/Limitations WFL;WFL with corrective lenses   RUE Assessment   RUE Assessment WFL- Within Functional Limits   LUE Assessment   LUE Assessment WFL- Within Functional Limits   Trunk Assessment   Trunk Assessment WFL-Within Functional Limits   Musculoskeletal   RLE Weight-Bearing Status weight-bearing as tolerated   Grip Strength   Grip Left (4/5) good, left   Right Grip (5/5) normal, right   Bed Mobility Assessment/Treatment   Bed Mobility, Assistive Device bed rails   Supine-Sit  Independence stand-by assistance   Sit to Supine, Independence stand-by assistance   Safety Issues decreased use of legs for bridging/pushing   Transfer Assessment/Treatment   Sit-Stand Independence stand-by assistance   Stand-Sit Independence stand-by assistance   Sit-Stand-Sit, Assist Device walker, front wheeled   ADL Assessment/Intervention   ADL Comments Eating:  Independent.  Grooming:  Setup.  UB bathe/dress:  Setup. LB Bathe/dress:  Min A.  Toileting: SBA.  Functional transfers: SBA   Balance Skill Training   Sitting Balance: Static good balance   Sitting, Dynamic (Balance) good balance   Sit-to-Stand Balance good balance   Standing Balance: Static good balance   Post Treatment Status   Post Treatment Patient Status Patient sitting on edge of bed;Call light within reach;Telephone within reach   Support Present Post Treatment  Family present   Communication Administrator   Communication Post Treatment Comment Patient will be ready for discharge post follow-up adaptive equipment training for LB  ADLs with Margreta Journey , C ota/L   Planned Therapy Interventions, OT Eval   Planned Therapy Interventions ADL retraining;bed mobility training;transfer training   Clinical Impression   Criteria for Skilled Therapeutic Interventions Met (OT) yes;meets criteria;skilled treatment is necessary   Rehab Potential good   Therapy Frequency 1x/day   Predicted Duration of Therapy until discharge   Anticipated Equipment Needs at Discharge none anticipated   Anticipated Discharge Disposition home with assist   Evaluation Complexity Justification   Occupational Profile Review Brief history   Performance Deficits 1-3 deficits   Clinical Decision Making Low analytic complexity   Evaluation Complexity Low       TREATMENT PLAN: ADL/IADL TRAINING, EDUCATION, and DME/AE TRAINING  EVALUATION COMPLEXITY: CLINICAL DECISION MAKING OF LOW COMPLEXITY AS INDICATED BY PMH, OCCUPATIONAL THERAPY ASSESSMENT OF MUSCULOSKELETAL AND  NEUROLOGICAL SYSTEMS AND ACTIVITY LIMITATIONS. CLINICAL PRESENTATION IS STABLE AND UNCOMPLICATED.      EVALUATION 15 minutes    Therapist:      Geanie Logan, OT,08/21/2021 15:15 ACTUAL TIME

## 2022-05-02 IMAGING — MR MRI HIP LT W/O CONTRAST
7 series · 40 of 40 positions shown · IV contrast (gadolinium)
Comparison: MRI right hip dated 02/13/2021.

﻿EXAM:  [id number]   MRI HIP LT W/O CONTRAST
INDICATION: Left hip pain. Previous right hip surgery.
TECHNIQUE: Multiplanar, multisequential MRI of the pelvis and left hip was performed without gadolinium contrast.

[Series 6: T1 · axial · left · 4.5mm · 1.41mm/px · z∈[-107,+38]mm · 8 of 30 slices shown (1 of 3)]
[im 1/30]
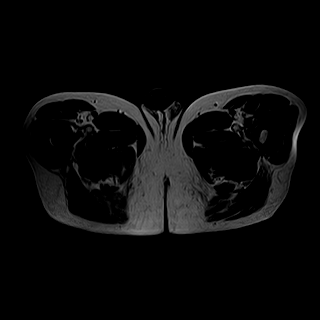
[im 5/30]
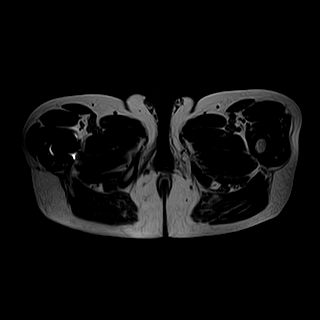
[im 9/30]
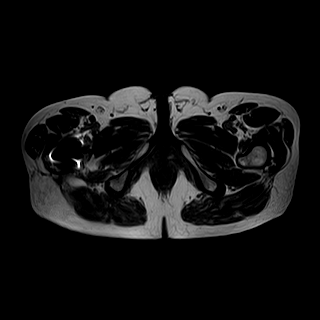
[im 13/30]
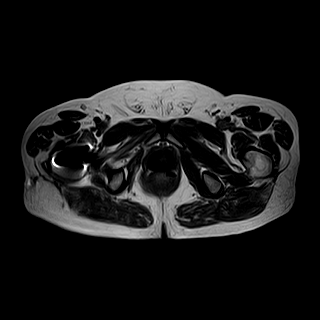
[im 17/30]
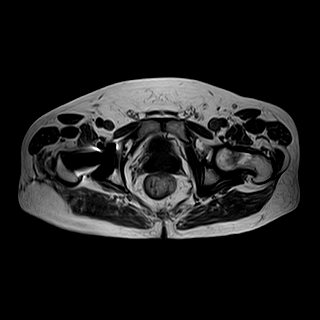
[im 21/30]
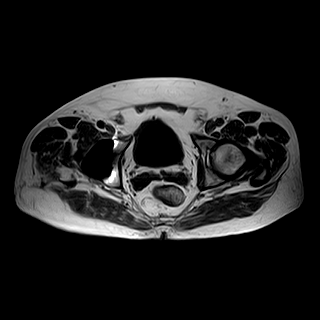
[im 25/30]
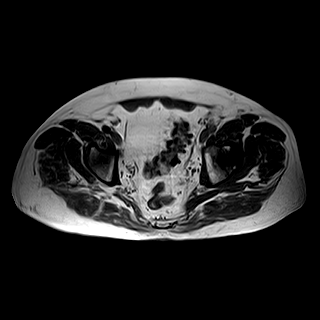
[im 30/30]
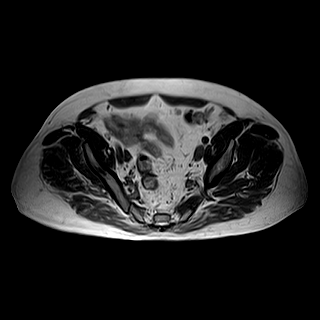

[Series 7: T2 fat-sat · axial · left · 4.5mm · 1.76mm/px · z∈[-107,+38]mm · 7 of 30 slices shown (1 of 3)]
[im 1/30]
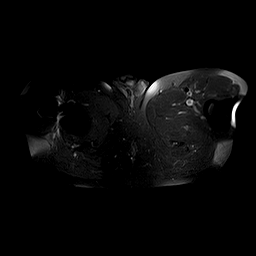
[im 5/30]
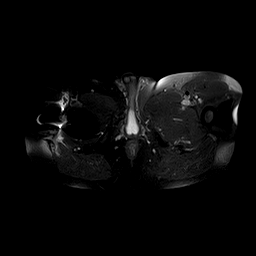
[im 10/30]
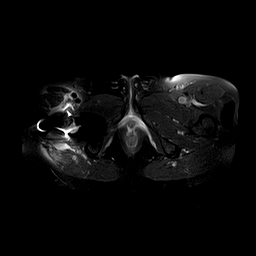
[im 15/30]
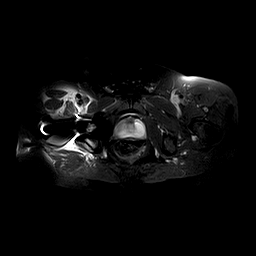
[im 20/30]
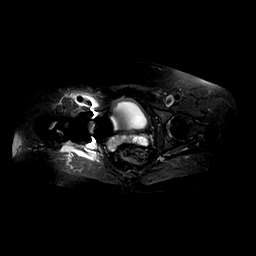
[im 25/30]
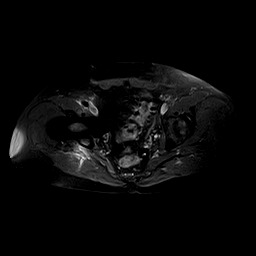
[im 30/30]
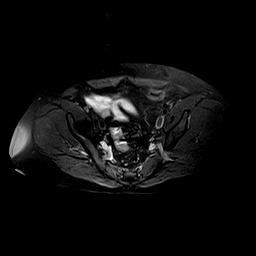

[Series 8: STIR · axial · left · 4.5mm · 2.01mm/px · z∈[-107,+38]mm · 7 of 30 slices shown]
[im 1/30]
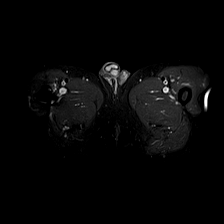
[im 5/30]
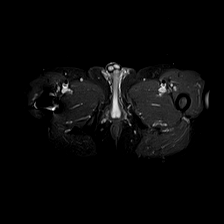
[im 10/30]
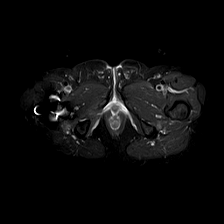
[im 15/30]
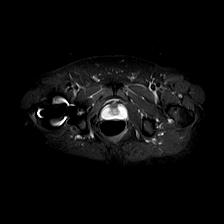
[im 20/30]
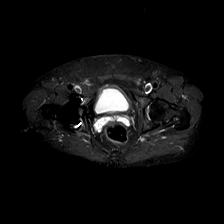
[im 25/30]
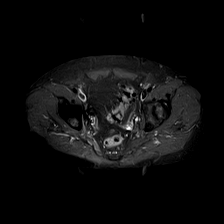
[im 30/30]
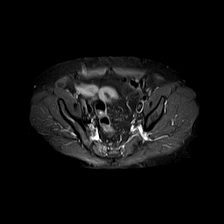

[Series 9: T1 · coronal · left · 4.5mm · 1.56mm/px · 5 of 20 slices shown (2 of 3)]
[im 1/20]
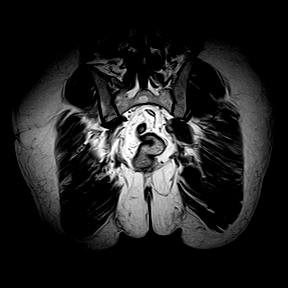
[im 5/20]
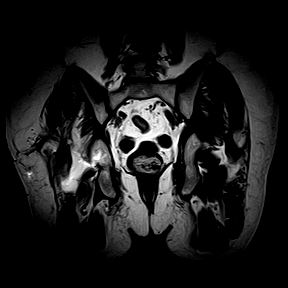
[im 10/20]
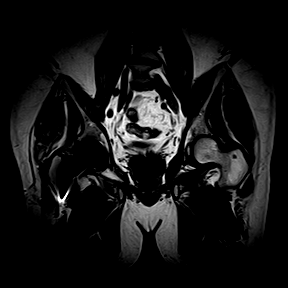
[im 15/20]
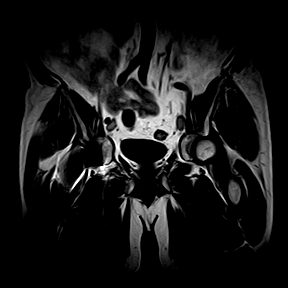
[im 20/20]
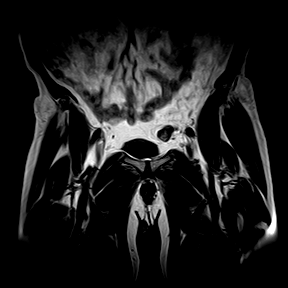

[Series 10: T2 fat-sat · coronal · left · 4.5mm · 1.76mm/px · 5 of 20 slices shown (2 of 3)]
[im 1/20]
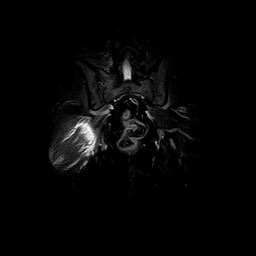
[im 5/20]
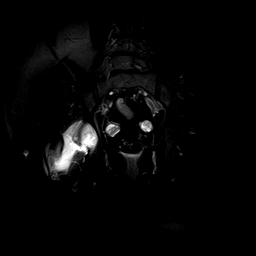
[im 10/20]
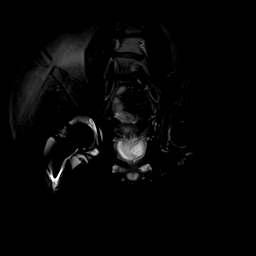
[im 15/20]
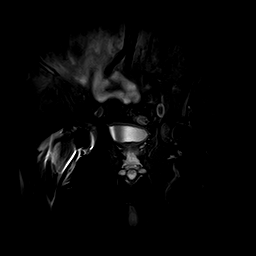
[im 20/20]
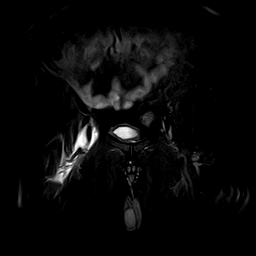

[Series 11: T1 · sagittal · left · 5.5mm · 1.02mm/px · 4 of 18 slices shown (3 of 3)]
[im 1/18]
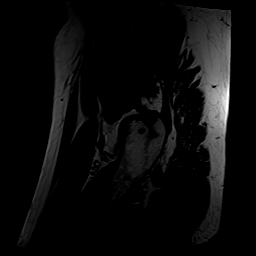
[im 6/18]
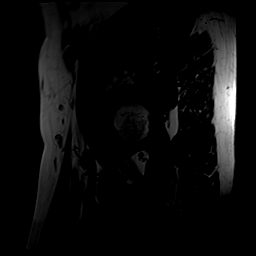
[im 12/18]
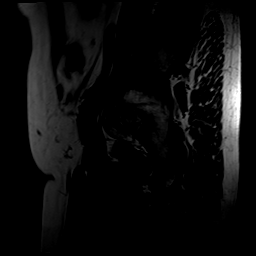
[im 18/18]
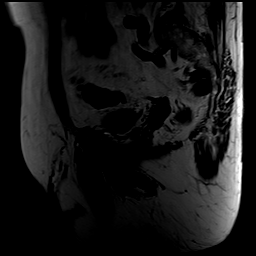

[Series 12: T2 fat-sat · sagittal · left · 5.5mm · 1.35mm/px · 4 of 18 slices shown (3 of 3)]
[im 1/18]
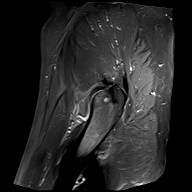
[im 6/18]
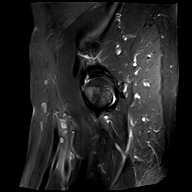
[im 12/18]
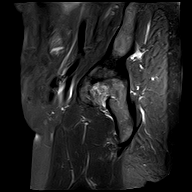
[im 18/18]
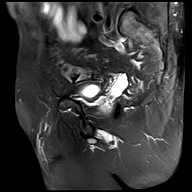

[40 of 40 positions shown; findings below may reference images not displayed]

FINDINGS: Artifact at the right hip from the right hip prosthesis is noted.  No evidence of acute bony lesions at the left hip.  No evidence of avascular necrosis at the left hip.  Mild degenerative changes of the hip joint are noted.  No evidence of acetabular labral tear.  However, couple of small cystic lesions at the superior lip of the acetabular labrum are noted measuring up to 6 mm in diameter.

Soft tissues at the left hip are normal.
IMPRESSION: 1. Interval postsurgical changes at the right hip.

2. No acute findings at the left hip.  Degenerative arthritis of the left hip joint is noted. Small cystic lesions of the superior lip of the acetabular labrum are noted measuring up to 6 mm in diameter. No evidence of displaced labral tear is seen.  

Electronically Signed by BUCHELI, ROBERTO SANTIAGO at [id number] [DATE]

## 2022-05-02 IMAGING — MR MRI LUMBAR SPINE WITHOUT CONTRAST
5 of 6 series · 32 of 48 positions shown · IV contrast (gadolinium)
Comparison: Lumbar spine MRI dated 02/13/2021.

﻿EXAM:  [id number]   MRI LUMBAR SPINE WITHOUT CONTRAST
INDICATION: Chronic low back pain.  Radicular symptoms to left hip and right hip.  Previous right hip surgery.  No previous back surgery.
TECHNIQUE: Multiplanar, multisequential MRI of the lumbosacral spine was performed without gadolinium contrast.

[Series 5: T2 · sagittal · 4.5mm · 0.94mm/px · 7 of 14 slices shown (1 of 3)]
[im 1/14]
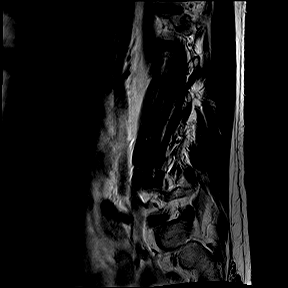
[im 3/14]
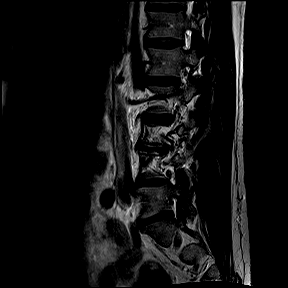
[im 5/14]
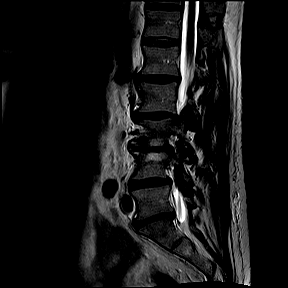
[im 7/14]
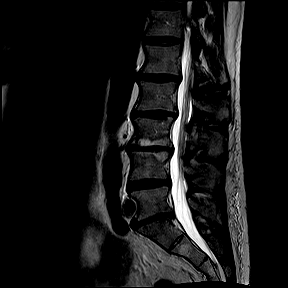
[im 9/14]
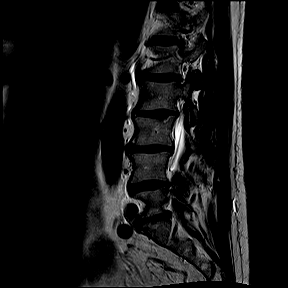
[im 11/14]
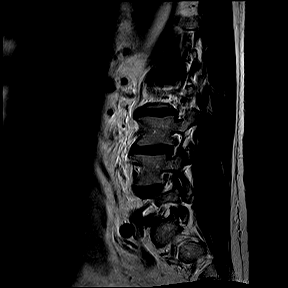
[im 14/14]
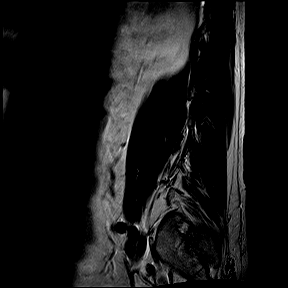

[Series 6: T1 · sagittal · 4.5mm · 0.94mm/px · 7 of 14 slices shown (1 of 2)]
[im 1/14]
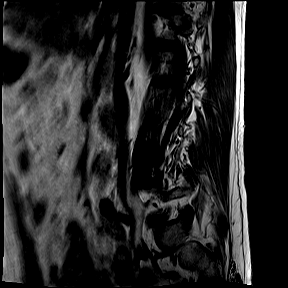
[im 3/14]
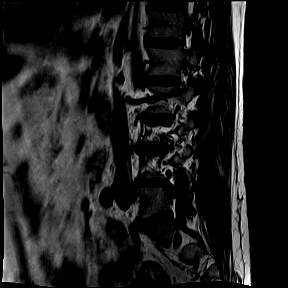
[im 5/14]
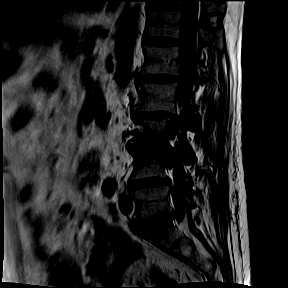
[im 7/14]
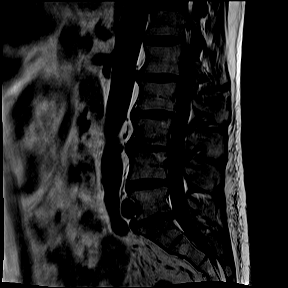
[im 9/14]
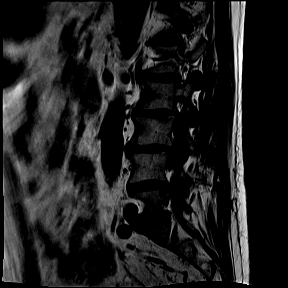
[im 11/14]
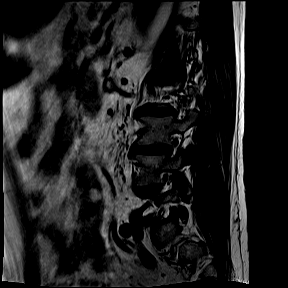
[im 14/14]
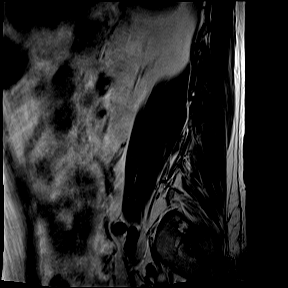

[Series 8: T2 · coronal · 5.0mm · 0.82mm/px · 8 of 18 slices shown (2 of 3)]
[im 1/18]
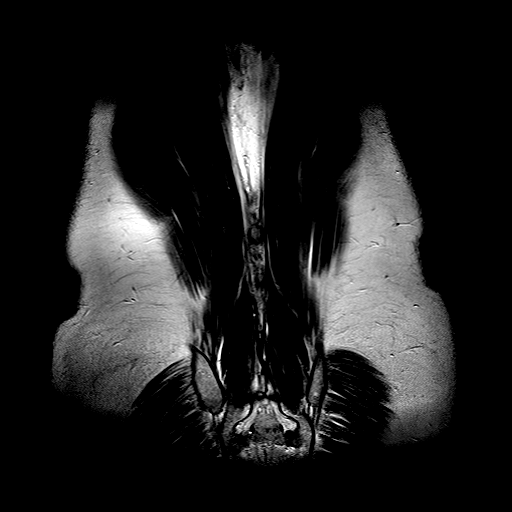
[im 3/18]
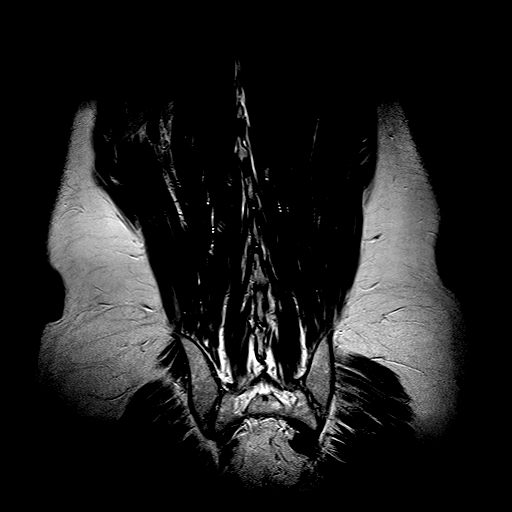
[im 5/18]
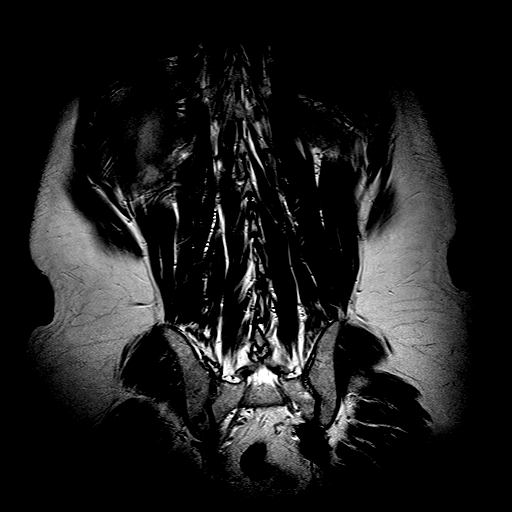
[im 8/18]
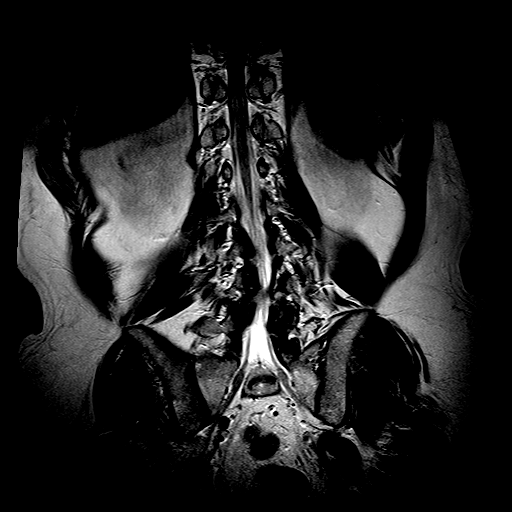
[im 10/18]
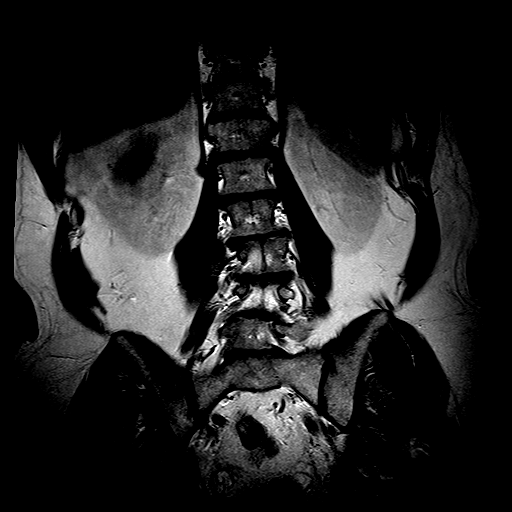
[im 13/18]
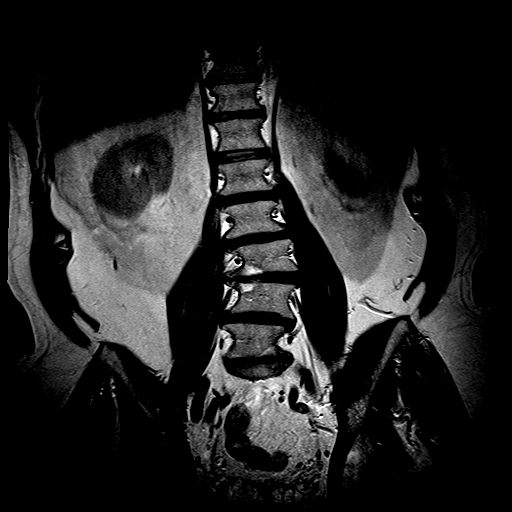
[im 15/18]
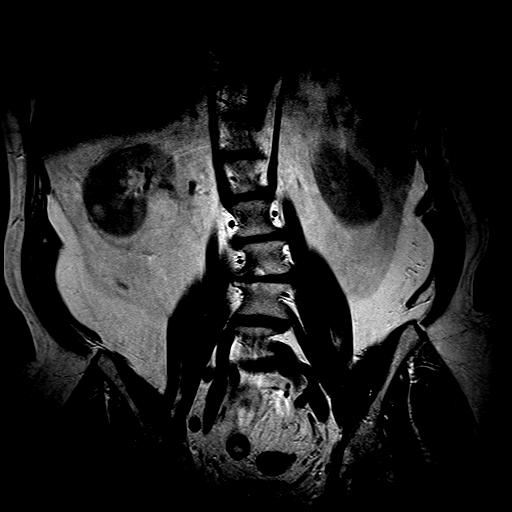
[im 18/18]
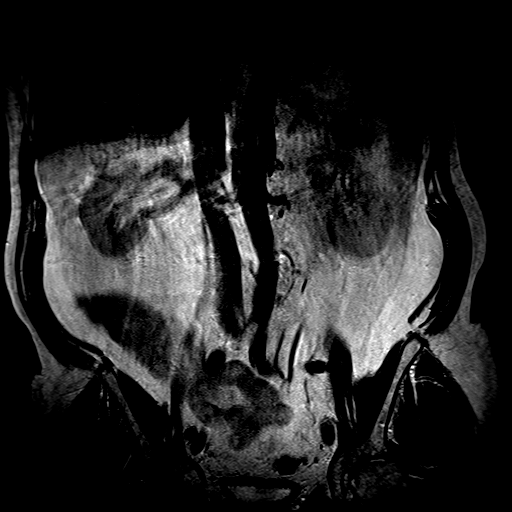

[Series 9: T2 · axial · 4.0mm · 0.52mm/px · z∈[-133,+42]mm · 8 of 23 slices shown (3 of 3)]
[im 1/23]
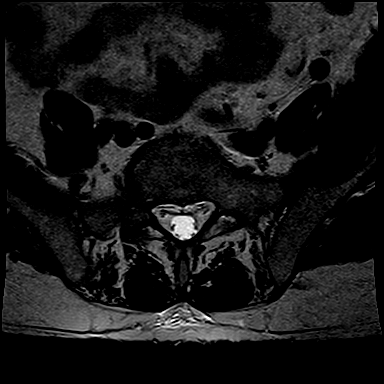
[im 3/23]
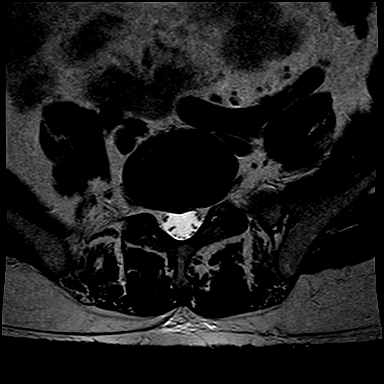
[im 8/23]
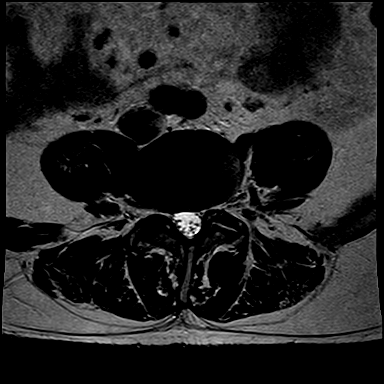
[im 10/23]
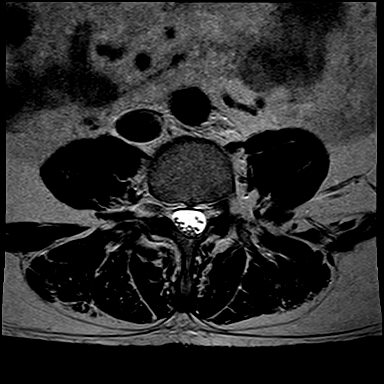
[im 13/23]
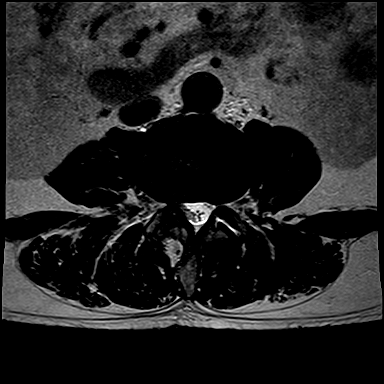
[im 15/23]
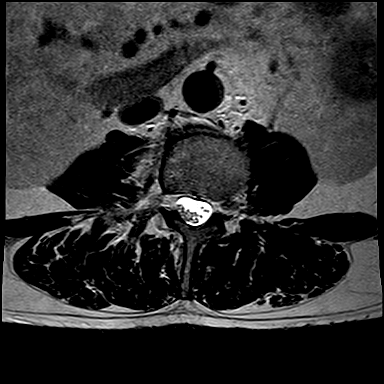
[im 20/23]
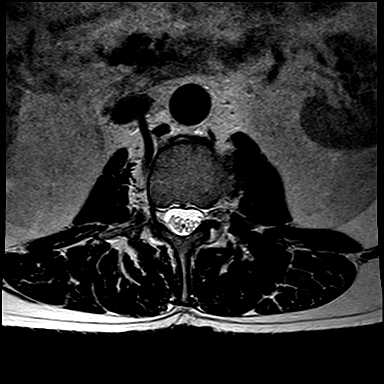
[im 23/23]
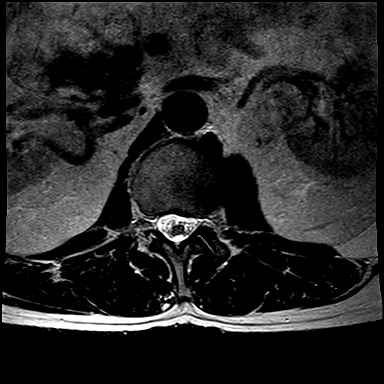

[Series 10: T1 · axial · 4.0mm · 0.52mm/px · z∈[-133,-124]mm · 2 of 23 slices shown (2 of 2)]
[im 1/23]
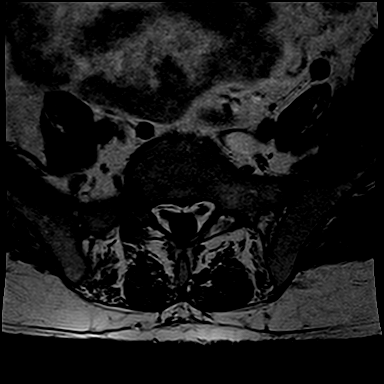
[im 3/23]
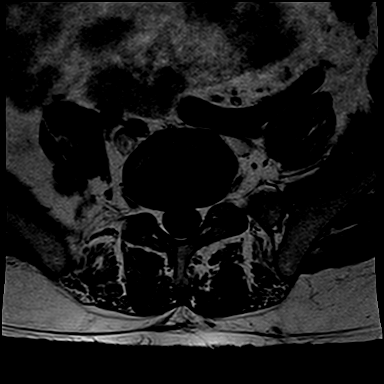

[32 of 48 positions shown; findings below may reference images not displayed]

FINDINGS: Modic type 1 inflammatory changes on both sides of L3-4 disc similar to prior study. 

At L1-2 level, asymmetric bulging annulus to the left of the midline is noted minimally compromising the thecal sac and lateral recess similar to previous study. 

At L2-3 level, significant degenerative disc disease is noted with bulging annulus and facet arthropathy causing moderate compromise of both lateral recesses and neural foramina and mild compromise of thecal sac in the midline with findings unchanged from 02/13/2021. 

At L3-4 level, severe bilateral facet arthropathy and degenerative disc changes with Modic inflammatory changes are noted causing significant biforaminal narrowing and compromise of lateral recesses, right more than the left and mild compromise of thecal sac in the midline.  Findings are unchanged from 02/13/2021. 

At L4-5 level, degenerative disc disease and facet arthropathy are noted with bulging annulus causing moderately significant compromise of both lateral recesses.  Right paracentral disc protrusion that was noted on the previous examination at this level is no longer seen.

At L5-S1 level, severe degenerative disc disease is noted with minimal retrolisthesis. Facet arthropathy and bulging annulus are causing significant biforaminal narrowing.  This finding is unchanged from previous study.

Paravertebral soft tissues are unremarkable.
IMPRESSION: 1.  No acute bone changes other than Modic inflammatory changes on both sides of L3-4 disc.

Electronically Signed by BUILES, KARL at [id number] [DATE]

## 2022-05-23 IMAGING — DX XRAY SHOULDER MINIMUM 2 VIEW RT
1 series · 4 of 4 positions shown · non-contrast
Comparison: None.

﻿EXAM:  [id number]   XRAY SHOULDER MINIMUM 2 VIEW RT
INDICATION: 61-year-old with persistent right shoulder pain with popping and cracking.  No history of surgery.

[Series 1: apobl · 0.14mm/px · 4 of 4 slices shown]
[im 1/4]
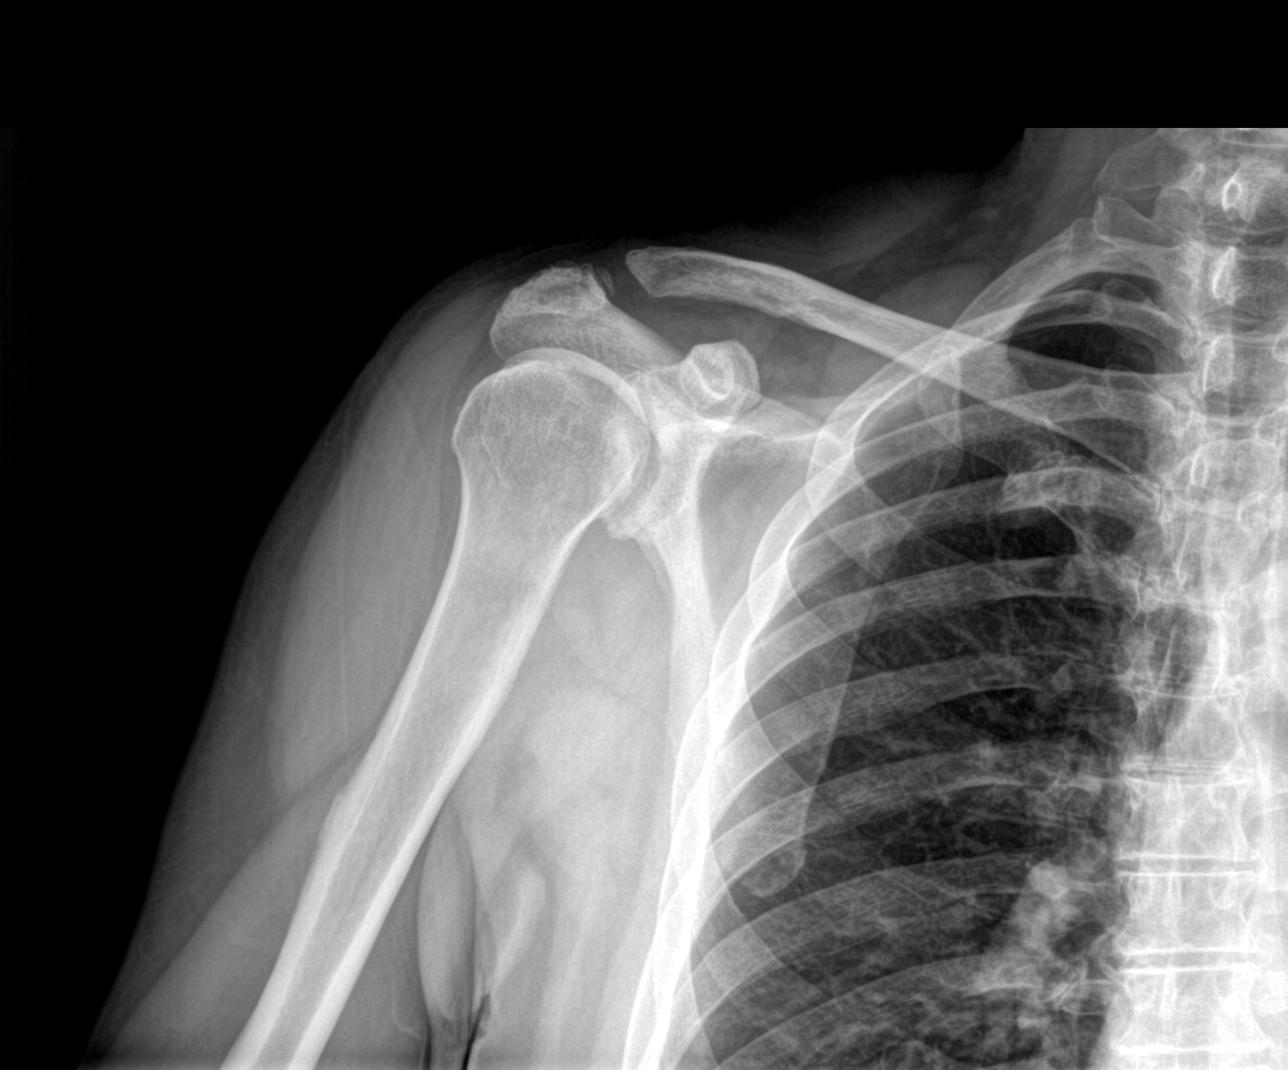
[im 2/4]
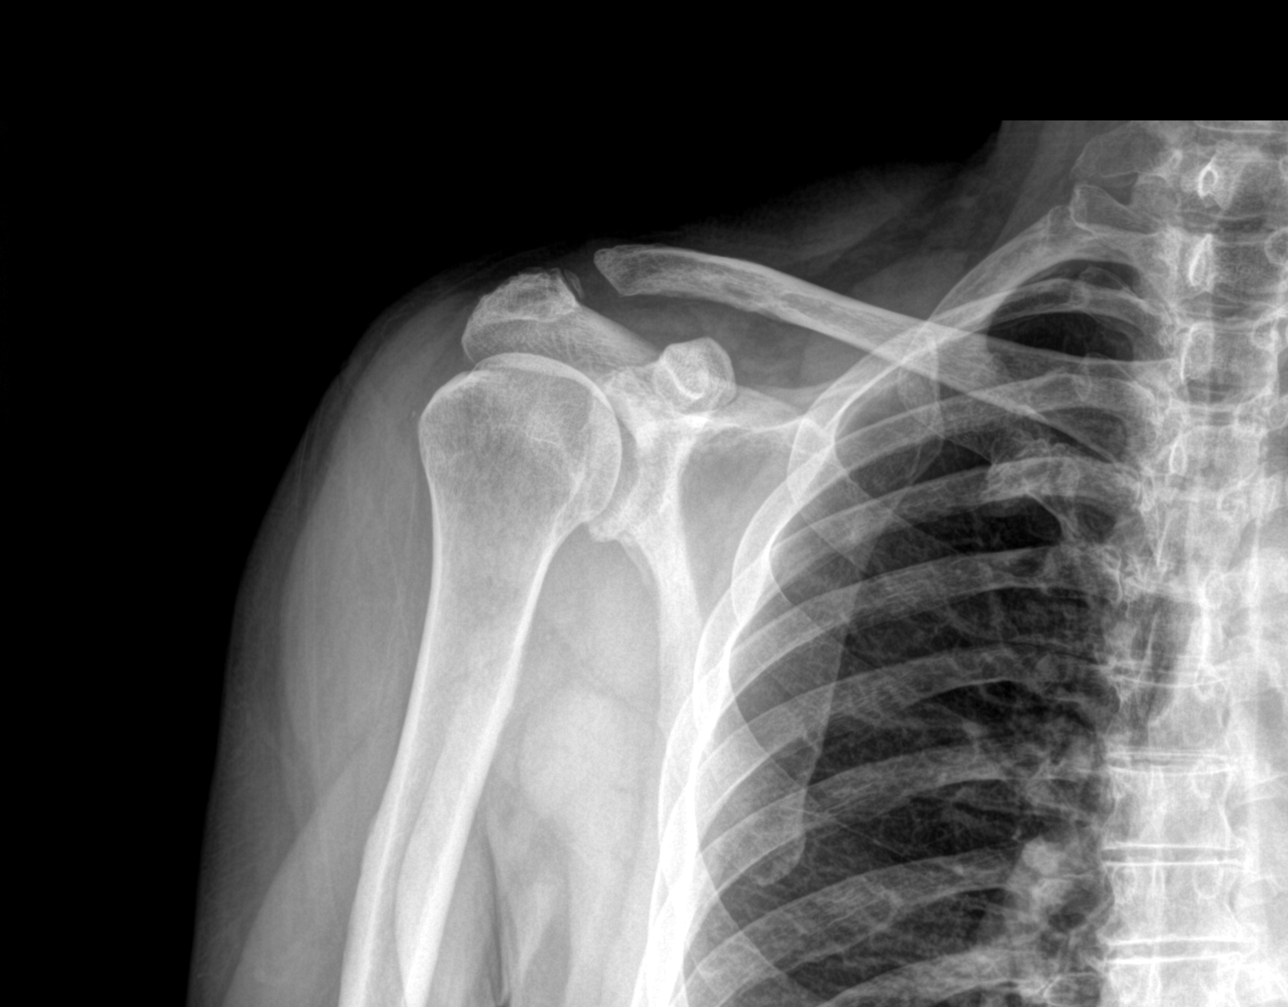
[im 3/4]
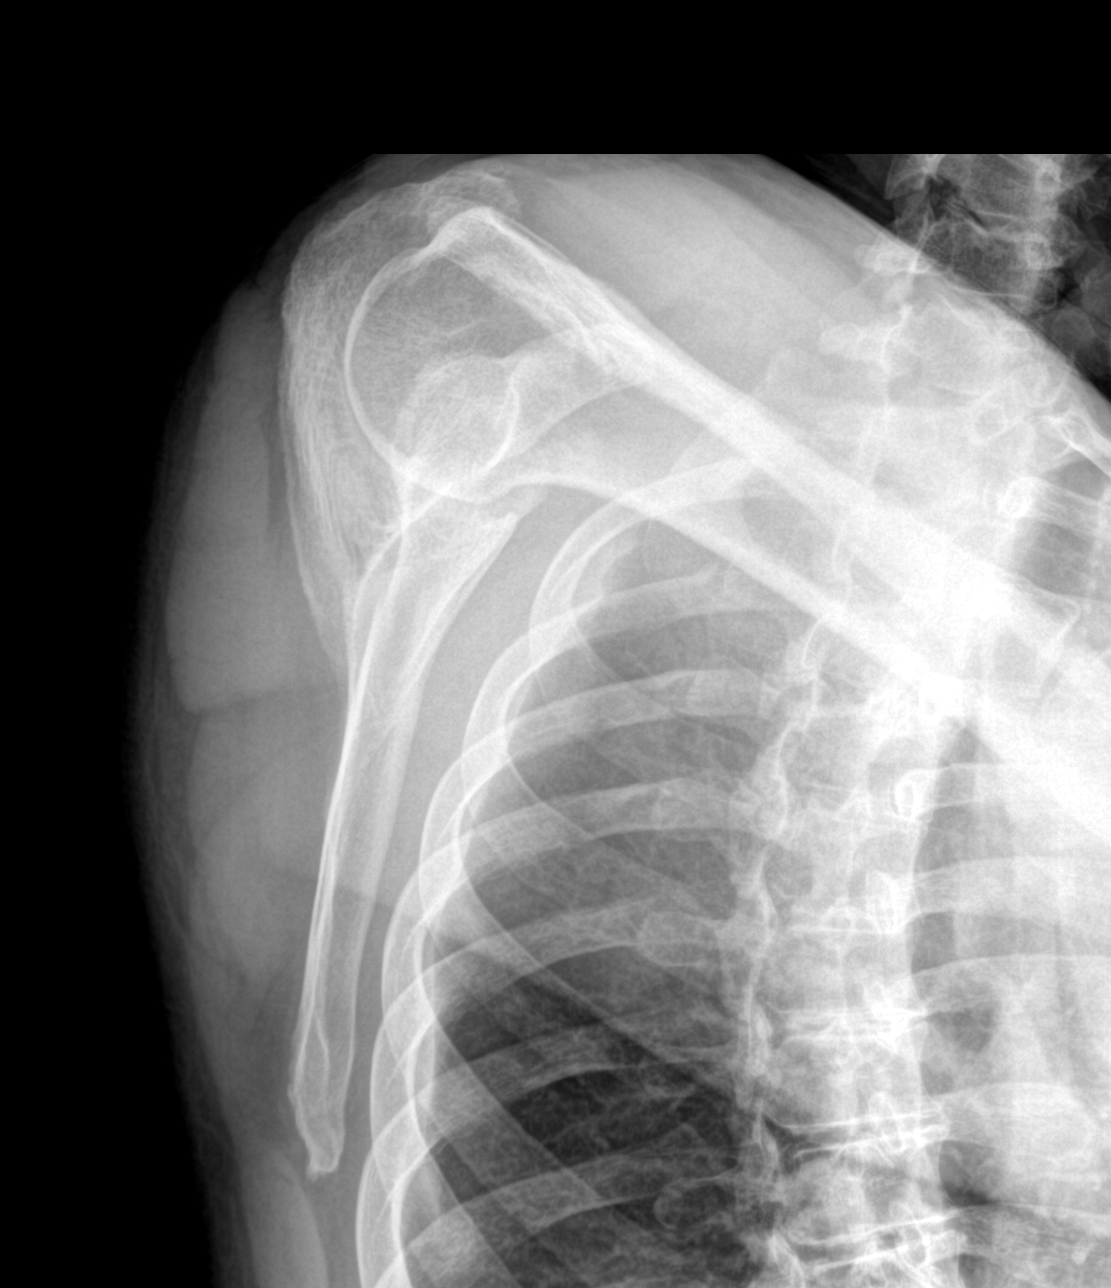
[im 4/4]
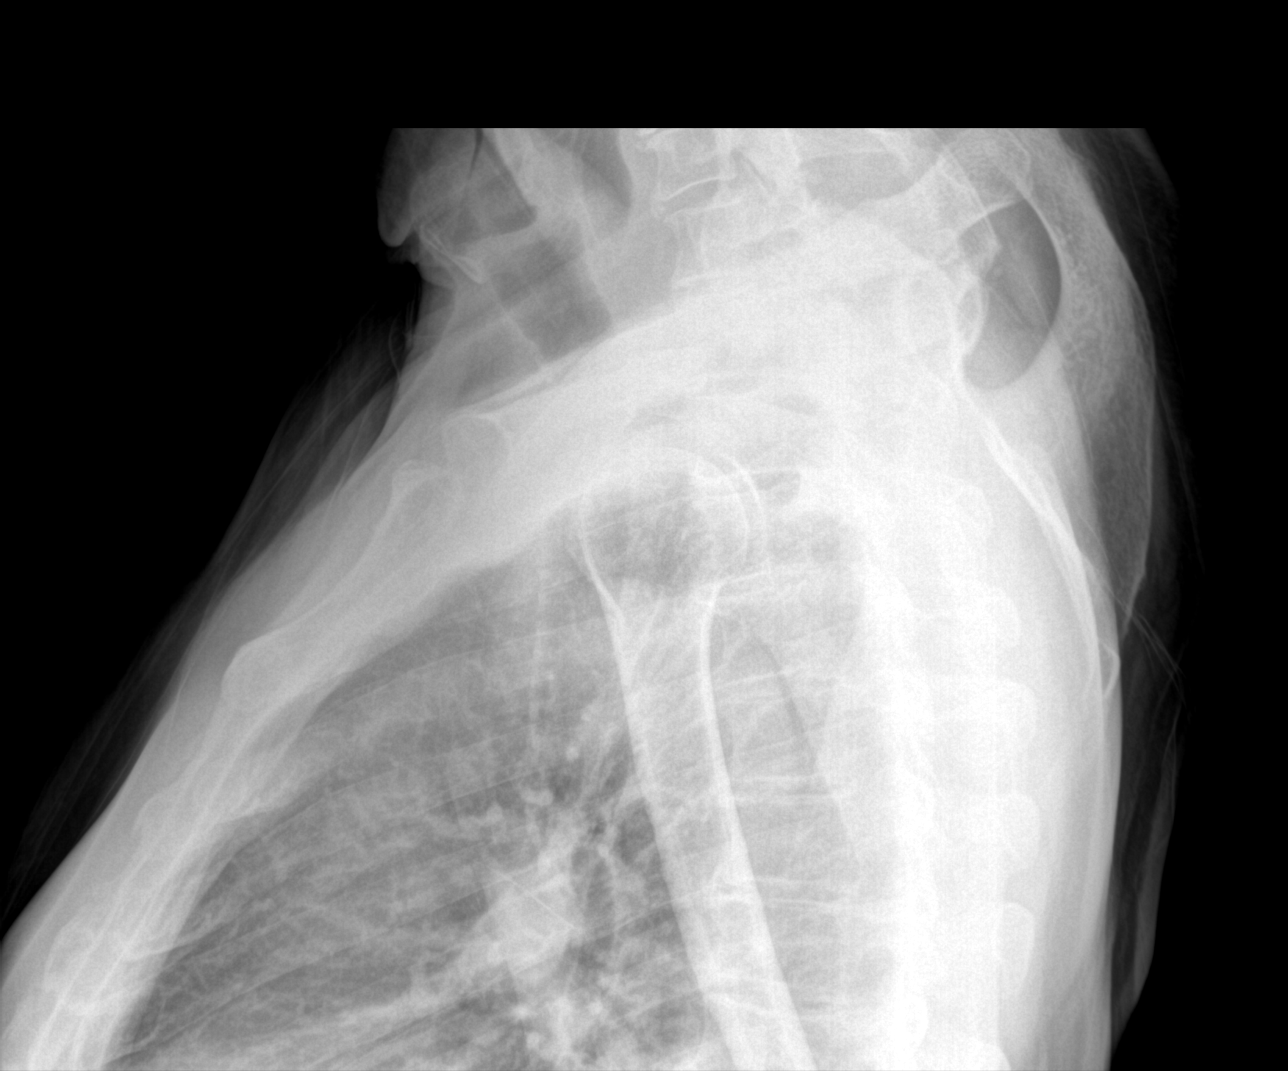

[4 of 4 positions shown; findings below may reference images not displayed]

FINDINGS: No acute bone lesions are seen at the right shoulder.  Irregularity of the acromion process near the AC joint suggests old injury. Secondary degenerative arthritis of the glenohumeral joint is noted as well as milder degenerative changes of AC joint.  Soft tissues are normal.
IMPRESSION: 1. No acute bony lesions at the right shoulder.  No dislocations of AC joint. 

2. Irregularity of the acromion process near the AC joint likely due to old injury.  Significant degenerative arthritis of glenohumeral joint. 

3. If symptoms are localized and persistent, further evaluation with MRI is indicated.

## 2022-11-03 IMAGING — MR MRI CERVICAL SPINE WITHOUT CONTRAST
4 of 5 series · 26 of 48 positions shown · non-contrast
Comparison: None available.

﻿EXAM:  [DATE]   MRI CERVICAL SPINE WITHOUT CONTRAST,MRI THORACIC SPINE WITHOUT CONTRAST
INDICATION: Chronic neck, back and shoulder pain, numbness of hands.
TECHNIQUE: Noncontrast multiplanar, multisequence MRI was performed.

[Series 5: T2 · sagittal · 3.5mm · 0.78mm/px · 7 of 11 slices shown (1 of 2)]
[im 1/11]
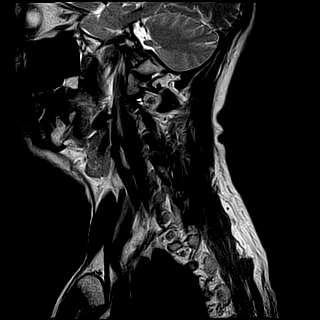
[im 2/11]
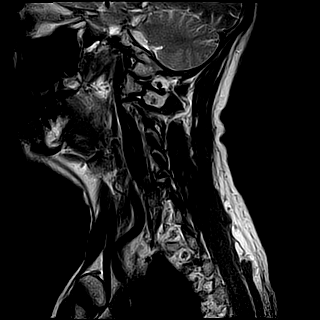
[im 4/11]
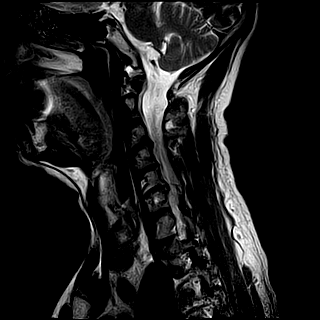
[im 6/11]
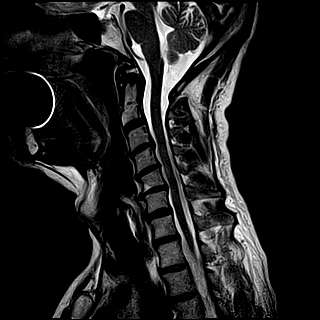
[im 7/11]
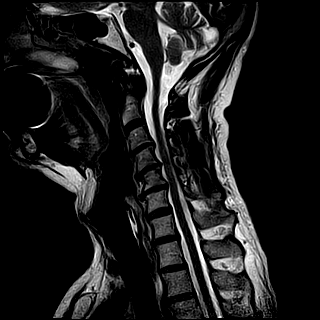
[im 9/11]
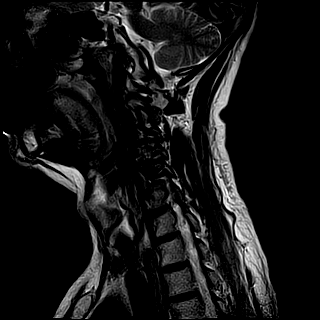
[im 11/11]
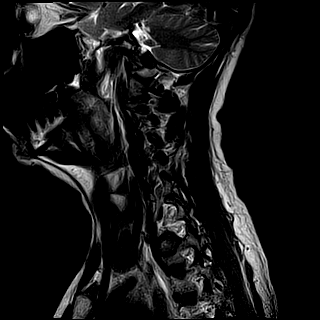

[Series 7: STIR · sagittal · 3.0mm · 0.49mm/px · 3 of 11 slices shown]
[im 2/11]
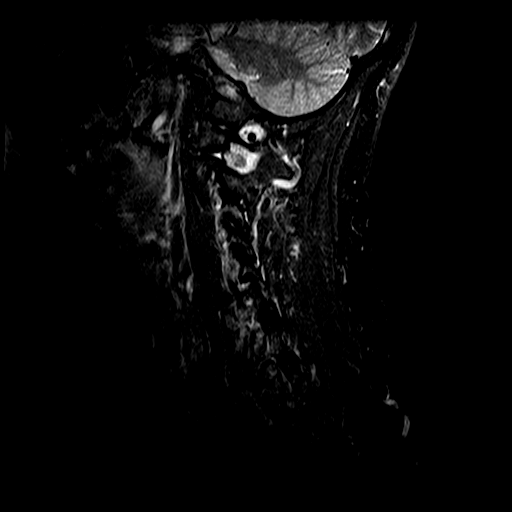
[im 6/11]
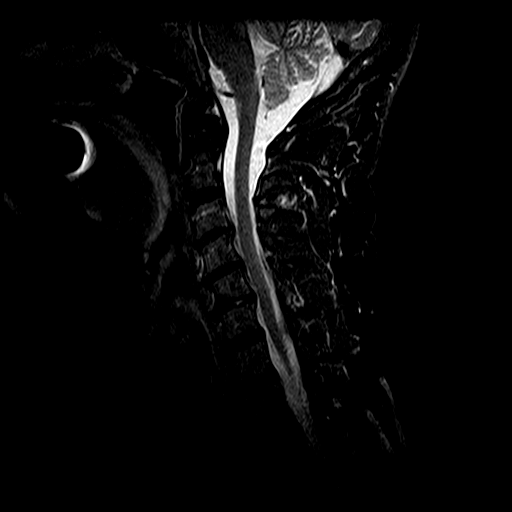
[im 9/11]
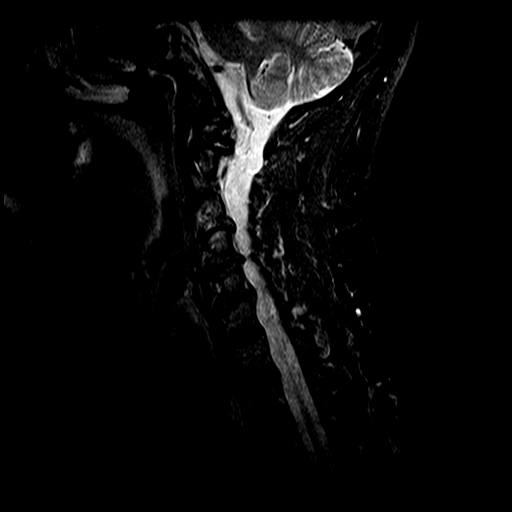

[Series 8: T2 · axial · 3.5mm · 0.40mm/px · z∈[-70,+11]mm · 10 of 26 slices shown (2 of 2)]
[im 2/26]
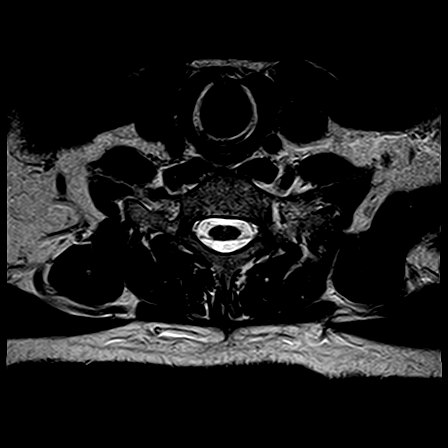
[im 4/26]
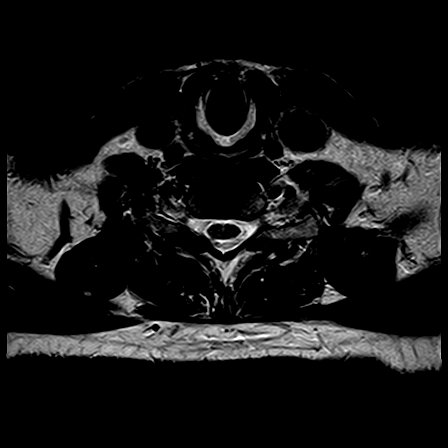
[im 6/26]
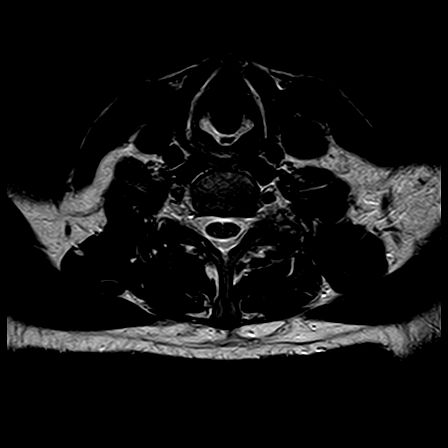
[im 9/26]
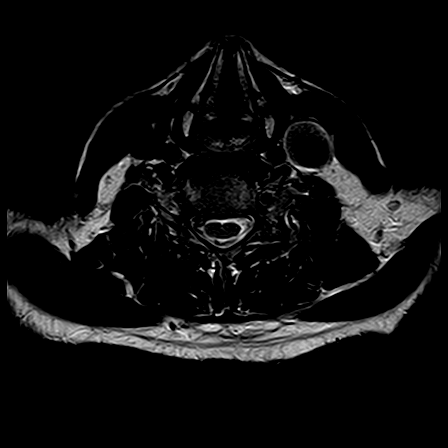
[im 12/26]
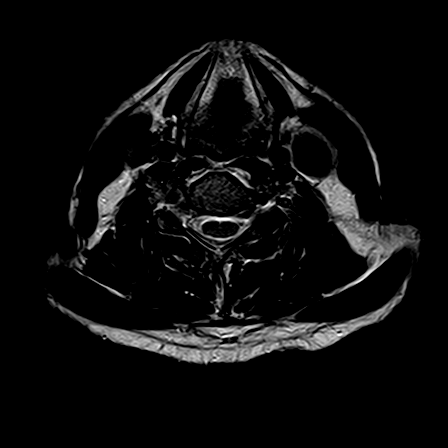
[im 14/26]
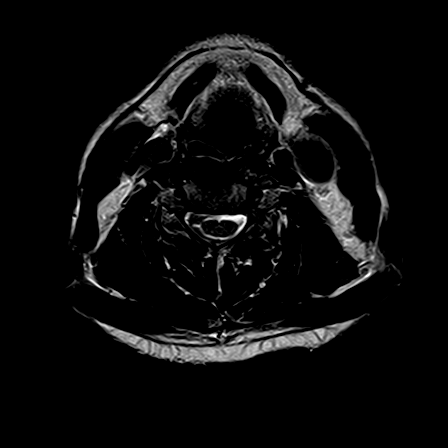
[im 16/26]
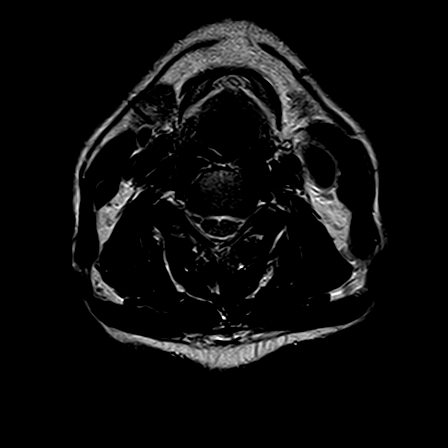
[im 19/26]
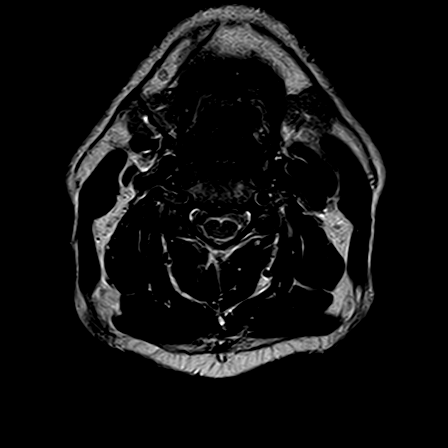
[im 22/26]
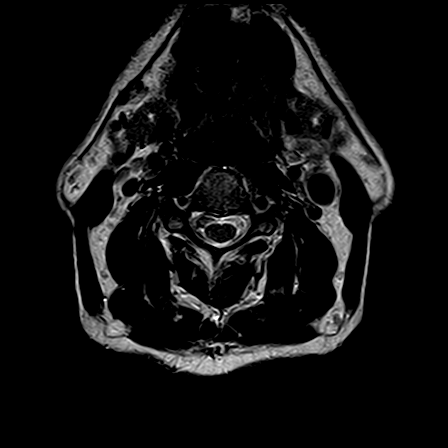
[im 26/26]
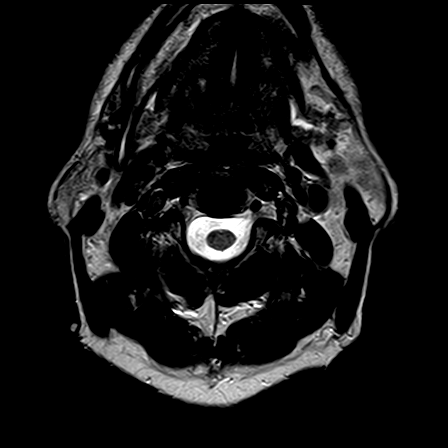

[Series 10: T1 · sagittal · 3.0mm · 0.49mm/px · 6 of 11 slices shown]
[im 1/11]
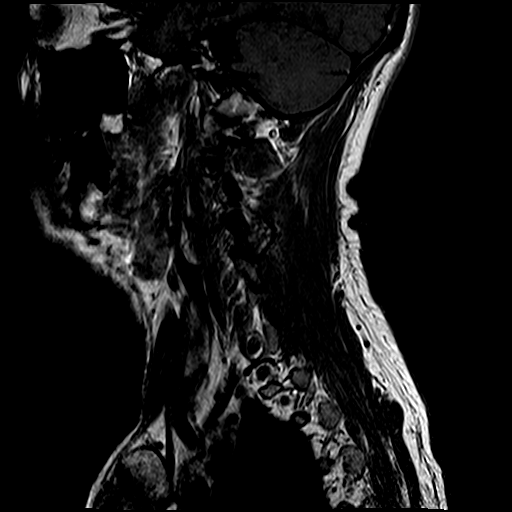
[im 2/11]
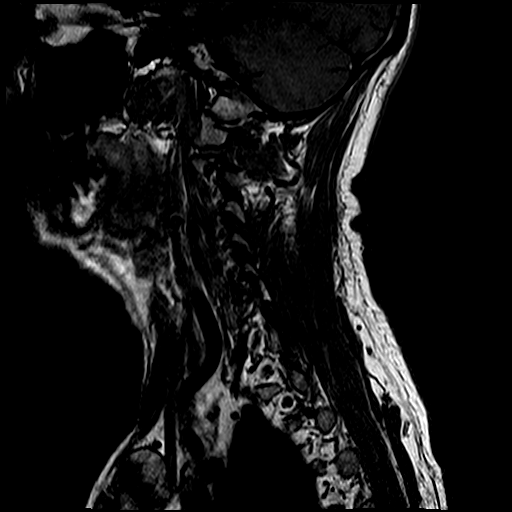
[im 4/11]
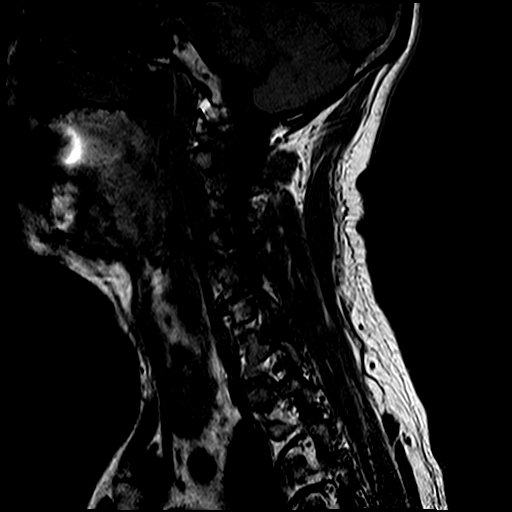
[im 6/11]
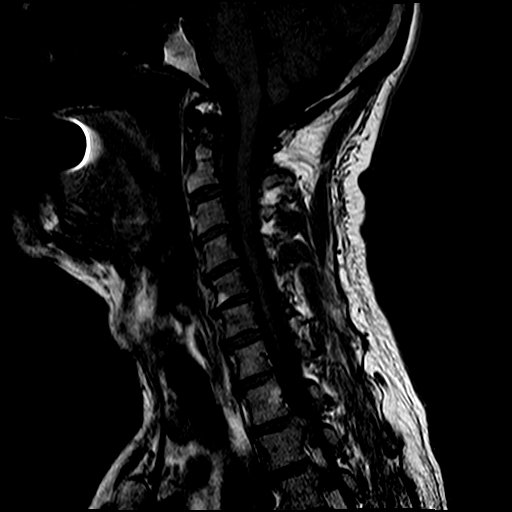
[im 7/11]
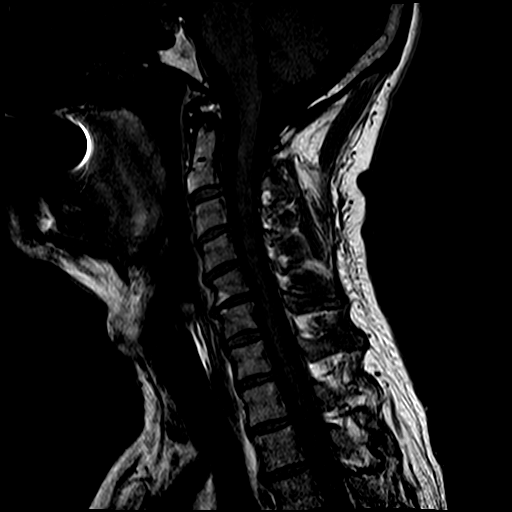
[im 9/11]
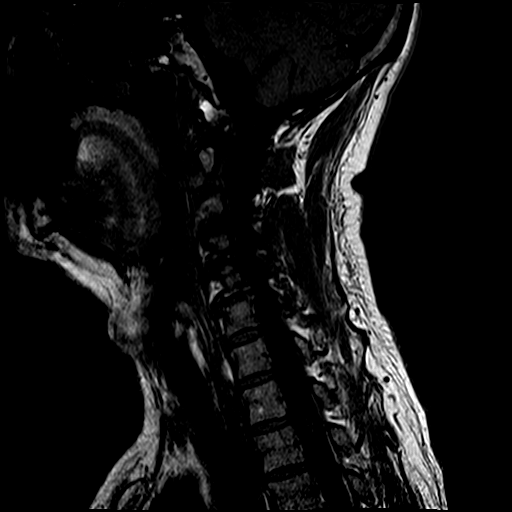

[26 of 48 positions shown; findings below may reference images not displayed]

FINDINGS: Examination of the cervical spine reveals moderate degenerative changes at C4-C5 and C5-C6 with disc space narrowing and osteophyte formation.  There are small disc-osteophyte complexes at C3-C4, C4-C5, and C5-C6 with slight compression of the thecal sac but no apparent spinal cord compression.  

There is no fracture, malalignment, significant marrow signal alteration, disc herniation, spinal stenosis, abnormality of the spinal cord, or Chiari malformation.  

Examination of the thoracic spine reveals mild degenerative changes at multiple levels.  At T7-T8, there is a small left paracentral disc herniation with slight compression of the left side of the thecal sac but no apparent spinal cord compression.  

Mild disc bulging is noted incidentally at L1-L2.  No other significant disc protrusion is seen.  

There is no fracture, malalignment, significant marrow signal alteration, spinal stenosis, or abnormality of the spinal cord.
IMPRESSION: 1. Moderate cervical spine degenerative changes and mild thoracic spine degenerative changes. 

2. Small disc-osteophyte complexes at C3-C4, C4-C5, and C5-C6.  

3. Small T7-T8 disc herniation.

## 2022-11-03 IMAGING — MR MRI THORACIC SPINE WITHOUT CONTRAST
4 of 5 series · 25 of 48 positions shown · non-contrast
Comparison: None available.

﻿EXAM:  [DATE]   MRI CERVICAL SPINE WITHOUT CONTRAST,MRI THORACIC SPINE WITHOUT CONTRAST
INDICATION: Chronic neck, back and shoulder pain, numbness of hands.
TECHNIQUE: Noncontrast multiplanar, multisequence MRI was performed.

[Series 5: T2 · sagittal · 4.3mm · 0.78mm/px · 9 of 13 slices shown (1 of 2)]
[im 1/13]
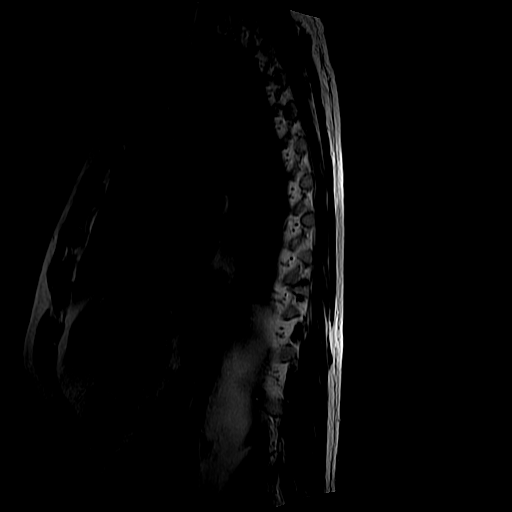
[im 2/13]
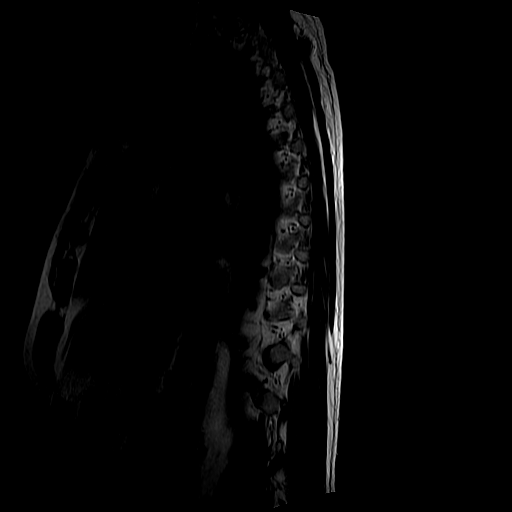
[im 4/13]
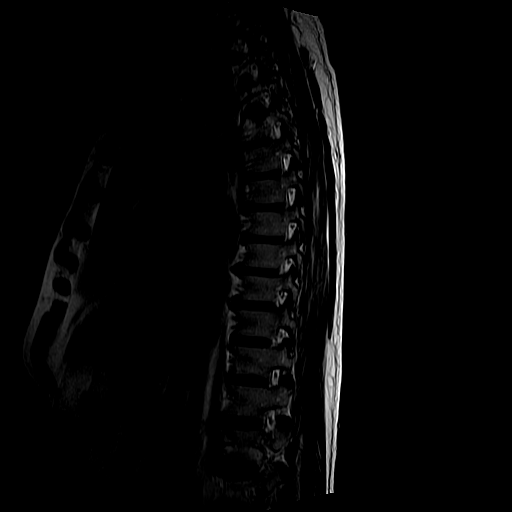
[im 5/13]
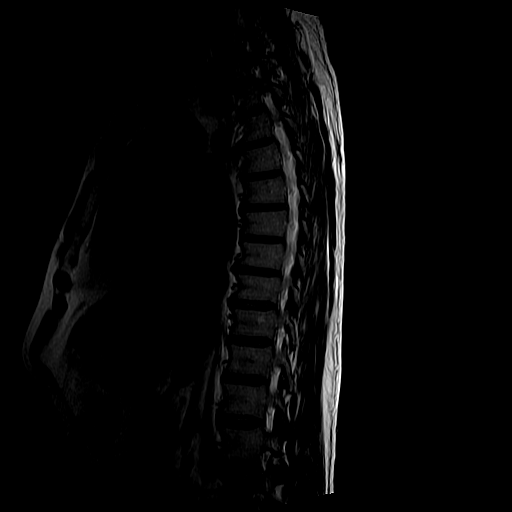
[im 7/13]
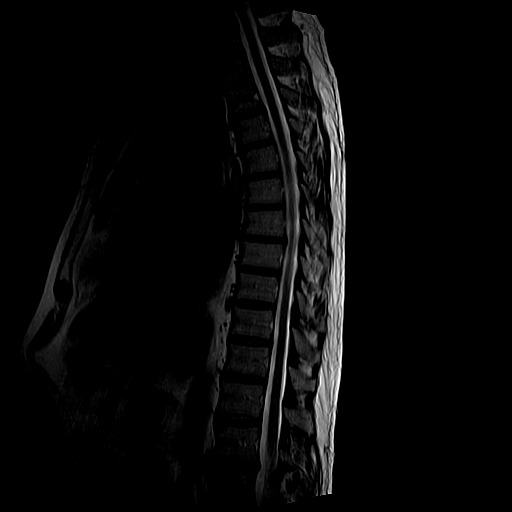
[im 8/13]
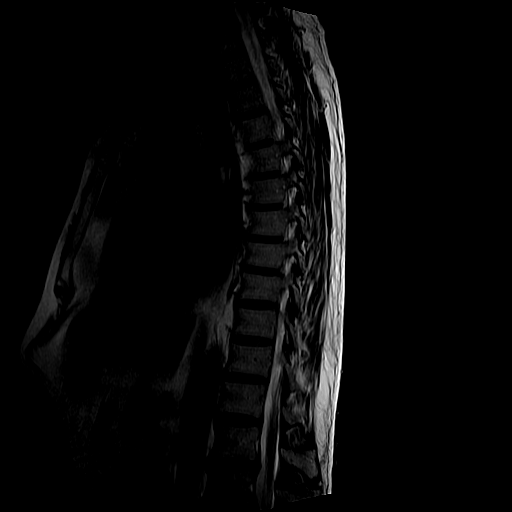
[im 10/13]
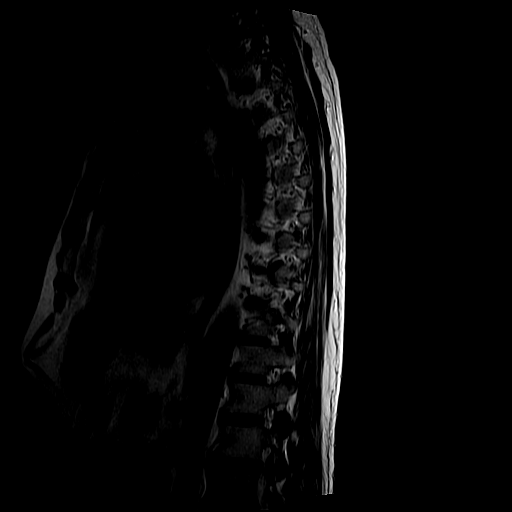
[im 11/13]
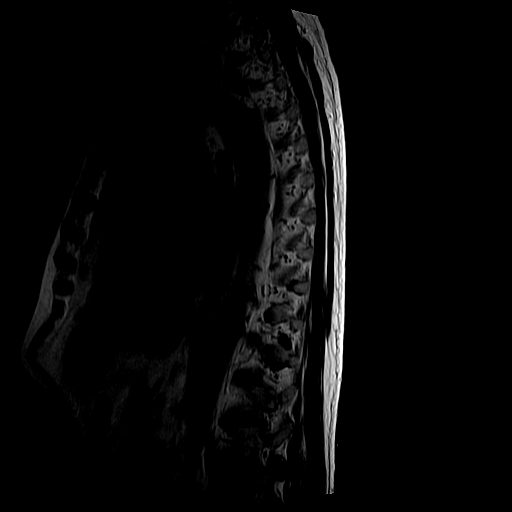
[im 13/13]
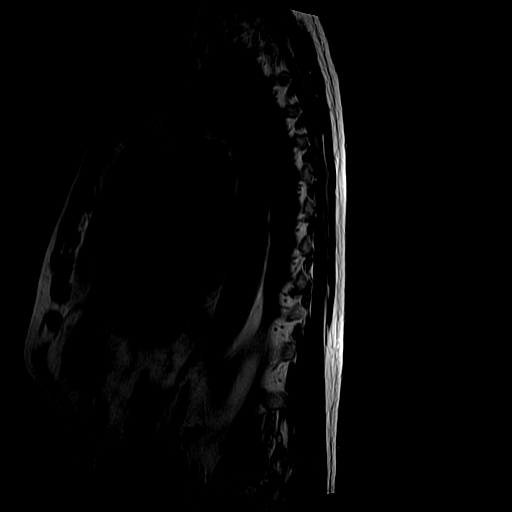

[Series 6: T1 · sagittal · 4.3mm · 0.78mm/px · 5 of 13 slices shown (1 of 2)]
[im 1/13]
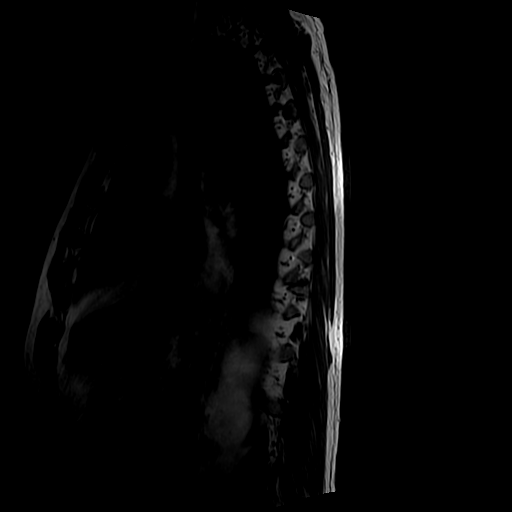
[im 2/13]
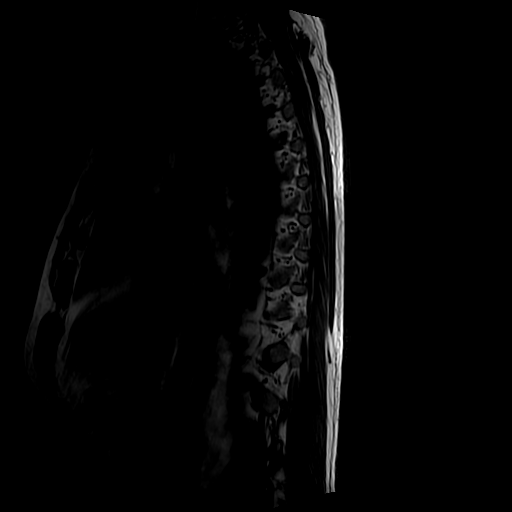
[im 4/13]
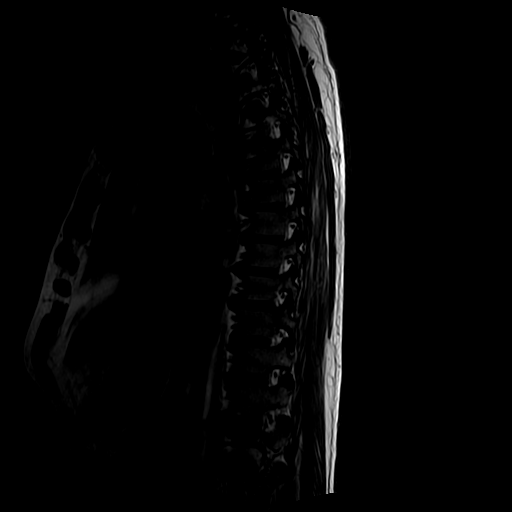
[im 7/13]
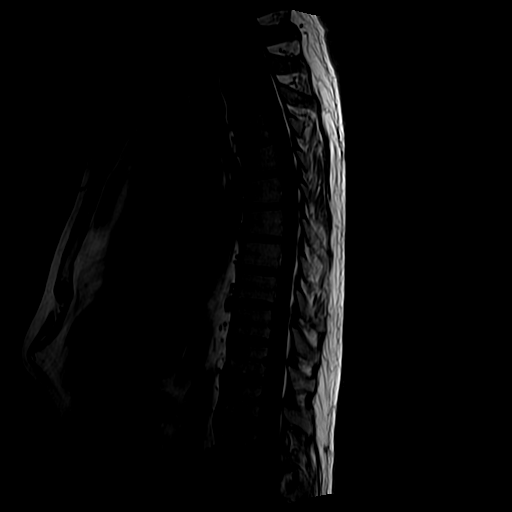
[im 11/13]
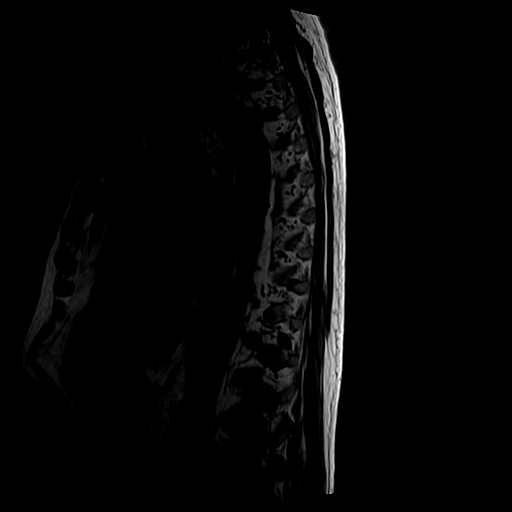

[Series 8: T2 · axial · 4.0mm · 0.62mm/px · z∈[-324,-38]mm · 8 of 13 slices shown (2 of 2)]
[im 1/13]
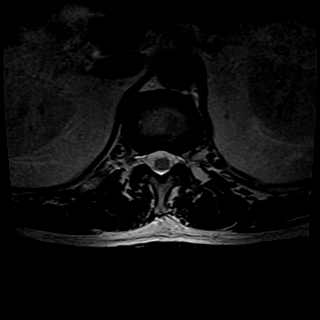
[im 2/13]
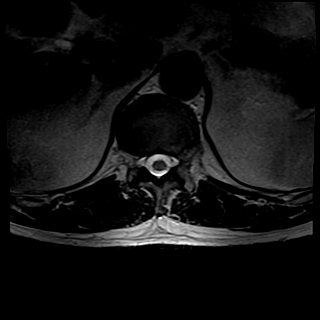
[im 5/13]
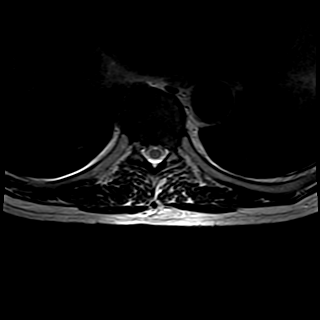
[im 6/13]
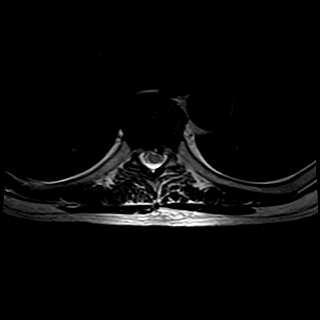
[im 7/13]
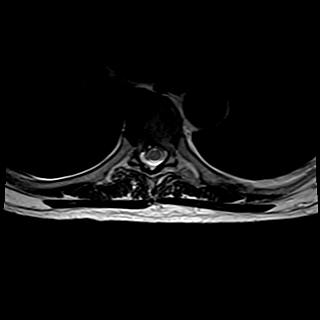
[im 9/13]
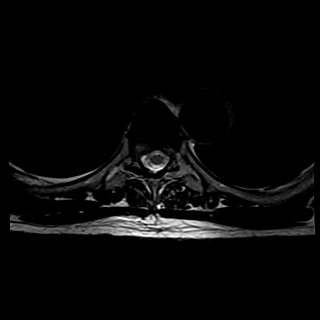
[im 11/13]
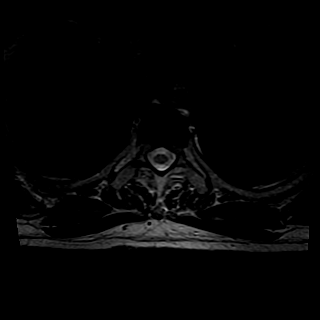
[im 13/13]
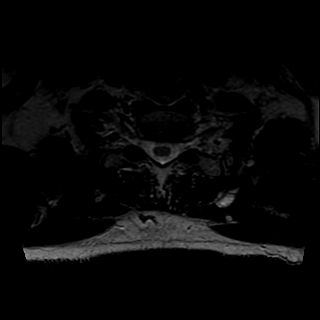

[Series 9: T1 · axial · 4.0mm · 0.62mm/px · z∈[-293,-86]mm · 3 of 13 slices shown (2 of 2)]
[im 2/13]
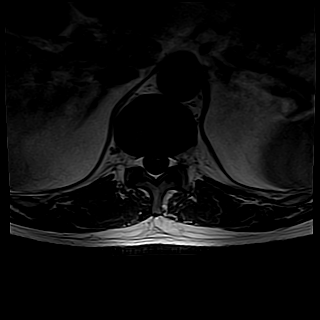
[im 7/13]
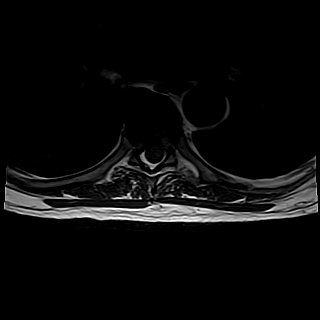
[im 11/13]
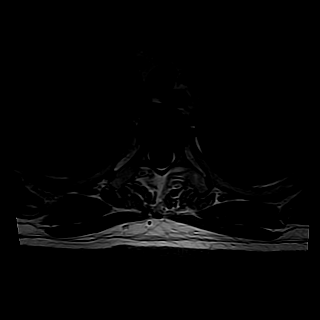

[25 of 48 positions shown; findings below may reference images not displayed]

FINDINGS: Examination of the cervical spine reveals moderate degenerative changes at C4-C5 and C5-C6 with disc space narrowing and osteophyte formation.  There are small disc-osteophyte complexes at C3-C4, C4-C5, and C5-C6 with slight compression of the thecal sac but no apparent spinal cord compression.  

There is no fracture, malalignment, significant marrow signal alteration, disc herniation, spinal stenosis, abnormality of the spinal cord, or Chiari malformation.  

Examination of the thoracic spine reveals mild degenerative changes at multiple levels.  At T7-T8, there is a small left paracentral disc herniation with slight compression of the left side of the thecal sac but no apparent spinal cord compression.  

Mild disc bulging is noted incidentally at L1-L2.  No other significant disc protrusion is seen.  

There is no fracture, malalignment, significant marrow signal alteration, spinal stenosis, or abnormality of the spinal cord.
IMPRESSION: 1. Moderate cervical spine degenerative changes and mild thoracic spine degenerative changes. 

2. Small disc-osteophyte complexes at C3-C4, C4-C5, and C5-C6.  

3. Small T7-T8 disc herniation.
